# Patient Record
Sex: Female | Born: 1970 | Race: Black or African American | Hispanic: No | Marital: Single | Smoking: Current every day smoker
Health system: Southern US, Community
[De-identification: ages and names within clinical notes are randomized; demographics above are authoritative.]

## PROBLEM LIST (undated history)

## (undated) ENCOUNTER — Ambulatory Visit: Source: Home / Self Care

## (undated) DIAGNOSIS — K219 Gastro-esophageal reflux disease without esophagitis: Secondary | ICD-10-CM

## (undated) DIAGNOSIS — Z8742 Personal history of other diseases of the female genital tract: Secondary | ICD-10-CM

## (undated) DIAGNOSIS — Z8619 Personal history of other infectious and parasitic diseases: Secondary | ICD-10-CM

## (undated) HISTORY — DX: Personal history of other infectious and parasitic diseases: Z86.19

## (undated) HISTORY — DX: Gastro-esophageal reflux disease without esophagitis: K21.9

## (undated) HISTORY — DX: Personal history of other diseases of the female genital tract: Z87.42

## (undated) HISTORY — PX: MULTIPLE TOOTH EXTRACTIONS: SHX2053

---

## 1999-10-25 ENCOUNTER — Emergency Department (HOSPITAL_COMMUNITY): Admission: EM | Admit: 1999-10-25 | Discharge: 1999-10-25 | Payer: Self-pay | Admitting: *Deleted

## 2000-08-07 ENCOUNTER — Emergency Department (HOSPITAL_COMMUNITY): Admission: EM | Admit: 2000-08-07 | Discharge: 2000-08-07 | Payer: Self-pay | Admitting: Emergency Medicine

## 2000-09-18 ENCOUNTER — Emergency Department (HOSPITAL_COMMUNITY): Admission: EM | Admit: 2000-09-18 | Discharge: 2000-09-18 | Payer: Self-pay | Admitting: Emergency Medicine

## 2001-04-12 ENCOUNTER — Emergency Department (HOSPITAL_COMMUNITY): Admission: EM | Admit: 2001-04-12 | Discharge: 2001-04-12 | Payer: Self-pay | Admitting: Emergency Medicine

## 2002-01-08 ENCOUNTER — Emergency Department (HOSPITAL_COMMUNITY): Admission: EM | Admit: 2002-01-08 | Discharge: 2002-01-08 | Payer: Self-pay | Admitting: Emergency Medicine

## 2002-01-08 ENCOUNTER — Encounter: Payer: Self-pay | Admitting: Emergency Medicine

## 2004-03-16 ENCOUNTER — Emergency Department (HOSPITAL_COMMUNITY): Admission: EM | Admit: 2004-03-16 | Discharge: 2004-03-16 | Payer: Self-pay | Admitting: Emergency Medicine

## 2005-11-27 ENCOUNTER — Emergency Department (HOSPITAL_COMMUNITY): Admission: EM | Admit: 2005-11-27 | Discharge: 2005-11-27 | Payer: Self-pay | Admitting: Emergency Medicine

## 2008-02-01 ENCOUNTER — Emergency Department (HOSPITAL_COMMUNITY): Admission: EM | Admit: 2008-02-01 | Discharge: 2008-02-01 | Payer: Self-pay | Admitting: Emergency Medicine

## 2010-06-19 ENCOUNTER — Emergency Department (HOSPITAL_COMMUNITY)
Admission: EM | Admit: 2010-06-19 | Discharge: 2010-06-19 | Disposition: A | Discharge status: Home / Self Care | Payer: Self-pay | Attending: Emergency Medicine | Admitting: Emergency Medicine

## 2010-06-19 DIAGNOSIS — A499 Bacterial infection, unspecified: Secondary | ICD-10-CM | POA: Insufficient documentation

## 2010-06-19 DIAGNOSIS — B9689 Other specified bacterial agents as the cause of diseases classified elsewhere: Secondary | ICD-10-CM | POA: Insufficient documentation

## 2010-06-19 DIAGNOSIS — N760 Acute vaginitis: Secondary | ICD-10-CM | POA: Insufficient documentation

## 2010-06-19 DIAGNOSIS — N72 Inflammatory disease of cervix uteri: Secondary | ICD-10-CM | POA: Insufficient documentation

## 2010-06-19 DIAGNOSIS — M545 Low back pain, unspecified: Secondary | ICD-10-CM | POA: Insufficient documentation

## 2010-06-19 DIAGNOSIS — N390 Urinary tract infection, site not specified: Secondary | ICD-10-CM | POA: Insufficient documentation

## 2010-06-19 LAB — URINALYSIS, ROUTINE W REFLEX MICROSCOPIC
Bilirubin Urine: NEGATIVE
Ketones, ur: NEGATIVE mg/dL
Nitrite: NEGATIVE
Protein, ur: NEGATIVE mg/dL
Specific Gravity, Urine: 1.021 (ref 1.005–1.030)
Urine Glucose, Fasting: NEGATIVE mg/dL
Urobilinogen, UA: 0.2 mg/dL (ref 0.0–1.0)
pH: 6 (ref 5.0–8.0)

## 2010-06-19 LAB — WET PREP, GENITAL
Trich, Wet Prep: NONE SEEN
Yeast Wet Prep HPF POC: NONE SEEN

## 2010-06-19 LAB — URINE MICROSCOPIC-ADD ON

## 2010-06-19 LAB — POCT PREGNANCY, URINE: Preg Test, Ur: NEGATIVE

## 2010-06-20 LAB — GC/CHLAMYDIA PROBE AMP, GENITAL
Chlamydia, DNA Probe: NEGATIVE
GC Probe Amp, Genital: NEGATIVE

## 2010-09-26 NOTE — H&P (Signed)
NAMESHATERRIA, SAGER                           ACCOUNT NO.:  [id number]   MEDICAL RECORD NO.:  [id number]                   PATIENT TYPE:  EMS   LOCATION:  ED                                   FACILITY:  [facility]   PHYSICIAN:  Othelia Pulling, MD                     DATE OF BIRTH:  [birth year]   DATE OF ADMISSION:  01/08/2002  DATE OF DISCHARGE:  01/08/2002                                HISTORY & PHYSICAL   CHIEF COMPLAINT:  Chest pain off and on for several hours.   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  For the past two or three weeks, he has had atypical  symptoms he attributed to indigestion and gas.  He was scheduled to  have a  stress adenosine nuclear test the following day this admission.  He has had  no diaphoresis.  The pain has been localized to the upper precordial area  with slight radiation into the left arm.  He has had no diaphoresis.  Unaware of any arrhythmia.  He has had no dizziness and no nausea or  vomiting.   PAST MEDICAL HISTORY:  1. Cardiac disease, having had severe aortic insufficiency for which he had     an aortic valve replacement at [facility] in 1986.     This was complicated by massive pericardial effusion, but was handled     without difficulty.  He has done extremely well since that date.  He has     worked full time and has been fairly active physically.  Annual     echocardiograms of the heart have revealed good LV function and normal     functioning aortic valve prosthesis.  2. History of hypertension for which he has been on Avapro 300 mg, Inderal     LA 80 mg, HCT 12.5 mg, and has been maintained on Coumadin therapy for     the valve replacement with an INR  in the range of about 2.0.  His last     INR was done one month ago and was reported to 1.7.  He takes 5 mg of     Coumadin daily.   FAMILY HISTORY:  Two brothers living and well who both have had bypass  surgery.  One brother is also a type 2 diabetic.   SOCIAL HISTORY:  Born in [place].  Married.  Two children living and  well.  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, now retired.   HABITS:  No history of tobacco or alcohol abuse.   MEDICATIONS:  Takes those medications listed above.   ALLERGIES:  None.  PAST SURGICAL HISTORY:  Prior surgery only for the valve replacement.   REVIEW OF SYMPTOMS:  HEENT:  Has had no syncope, dizziness, or headache.  Vision and hearing good.  RESPIRATORY:  Has had no cough, shortness of  breath, or symptoms of heart failure.  CARDIOVASCULAR:  See present illness.  It should be noted that he does have by echocardiogram evidence of LVH.  Likewise suspicious for his EKG changes.  GASTROINTESTINAL:  His appetite  has been good.  He has had what he thought to be indigestion and gas for the  past several weeks.  No change in his bowel habits.  ENDOCRINE:  His weight  has been stable.  No heat or cold intolerance.  MUSCULOSKELETAL:  Negative.  NEUROPSYCHIATRIC:  Negative.   PHYSICAL EXAMINATION:  GENERAL APPEARANCE:  A well-developed female in no  distress.  At the time he was examined he had no chest pain.  VITAL SIGNS:  Blood pressure 200/90 dropping to 160/80, pulse 75 with rare  PAC, respirations 18.  WEIGHT:  180 pounds.  HEENT:  Negative.  NECK:  No carotid bruits.  No pulsations.  No JVD.  CHEST:  No deformity.  Midline scar well healed.  HEART:  A grade 1-2 diastolic murmur noted at the base of the heart  radiating down towards the apex.  No evidence of significant gallop.  Has  good closure sounds of the prosthetic valve.  ABDOMEN:  Soft and nontender.  No masses.  GENITALIA:  Normal.  RECTAL:  Not done.  EXTREMITIES:  Good  pedal pulses.  No edema.  NEUROLOGIC:  Negative.  SKIN:  Warm and dry.  LYMPHATICS:  Negative.   LABORATORY DATA:  In the ER, electrocardiogram reveals LVH, left bundle  branch block, and QS waves across the first three precordial leads,  identical to an EKG of about 10 years ago.  Other laboratory work pending.   IMPRESSION:  1. Acute new onset unstable angina.  2. Status post aortic valve replacement in 1988 due to severe aortic     insufficiency.  3. Left ventricular hypertrophy.  4. Strong family history of coronary artery disease.   PLAN:  Due to typical clinical pattern to proceed to monitor and maintain  nitroglycerin drip.  Get the INR down to an adequate level for cardiac  catheterization, which is anticipated within the next 24 hours.                                               Othelia Pulling, MD    JB/MEDQ  D:  02/27/2002  T:  02/27/2002  Job:  [id number]

## 2011-01-04 ENCOUNTER — Emergency Department (HOSPITAL_COMMUNITY)
Admission: EM | Admit: 2011-01-04 | Discharge: 2011-01-04 | Disposition: A | Discharge status: Home / Self Care | Payer: Self-pay | Attending: Emergency Medicine | Admitting: Emergency Medicine

## 2011-01-04 DIAGNOSIS — N739 Female pelvic inflammatory disease, unspecified: Secondary | ICD-10-CM | POA: Insufficient documentation

## 2011-01-04 DIAGNOSIS — F17200 Nicotine dependence, unspecified, uncomplicated: Secondary | ICD-10-CM | POA: Insufficient documentation

## 2011-01-04 DIAGNOSIS — A599 Trichomoniasis, unspecified: Secondary | ICD-10-CM | POA: Insufficient documentation

## 2011-01-04 LAB — URINALYSIS, ROUTINE W REFLEX MICROSCOPIC
Bilirubin Urine: NEGATIVE
Glucose, UA: NEGATIVE mg/dL
Ketones, ur: NEGATIVE mg/dL
Nitrite: NEGATIVE
Protein, ur: NEGATIVE mg/dL
Specific Gravity, Urine: 1.016 (ref 1.005–1.030)
Urobilinogen, UA: 0.2 mg/dL (ref 0.0–1.0)
pH: 6 (ref 5.0–8.0)

## 2011-01-04 LAB — URINE MICROSCOPIC-ADD ON

## 2011-01-04 LAB — WET PREP, GENITAL
WBC, Wet Prep HPF POC: NONE SEEN
Yeast Wet Prep HPF POC: NONE SEEN

## 2011-01-04 LAB — POCT PREGNANCY, URINE: Preg Test, Ur: NEGATIVE

## 2011-01-05 LAB — GC/CHLAMYDIA PROBE AMP, GENITAL
Chlamydia, DNA Probe: NEGATIVE
GC Probe Amp, Genital: NEGATIVE

## 2011-03-31 ENCOUNTER — Emergency Department (HOSPITAL_COMMUNITY)
Admission: EM | Admit: 2011-03-31 | Discharge: 2011-03-31 | Disposition: A | Discharge status: Home / Self Care | Payer: Self-pay | Attending: Emergency Medicine | Admitting: Emergency Medicine

## 2011-03-31 DIAGNOSIS — M6740 Ganglion, unspecified site: Secondary | ICD-10-CM | POA: Insufficient documentation

## 2011-03-31 DIAGNOSIS — R109 Unspecified abdominal pain: Secondary | ICD-10-CM | POA: Insufficient documentation

## 2011-03-31 DIAGNOSIS — F17200 Nicotine dependence, unspecified, uncomplicated: Secondary | ICD-10-CM | POA: Insufficient documentation

## 2011-03-31 DIAGNOSIS — N898 Other specified noninflammatory disorders of vagina: Secondary | ICD-10-CM | POA: Insufficient documentation

## 2011-03-31 LAB — URINALYSIS, ROUTINE W REFLEX MICROSCOPIC
Bilirubin Urine: NEGATIVE
Glucose, UA: NEGATIVE mg/dL
Ketones, ur: NEGATIVE mg/dL
Leukocytes, UA: NEGATIVE
Nitrite: NEGATIVE
Protein, ur: NEGATIVE mg/dL
Specific Gravity, Urine: 1.019 (ref 1.005–1.030)
Urobilinogen, UA: 0.2 mg/dL (ref 0.0–1.0)
pH: 6.5 (ref 5.0–8.0)

## 2011-03-31 LAB — URINE MICROSCOPIC-ADD ON

## 2011-03-31 LAB — PREGNANCY, URINE: Preg Test, Ur: NEGATIVE

## 2011-03-31 NOTE — ED Notes (Signed)
Pt resting on stretcher. Pt cont to await Eval. No chage in pt status. Will cont to monitor

## 2011-03-31 NOTE — ED Provider Notes (Signed)
History    39yf with intermittent crampy lower abdominal pain. No appreciable exacerbating or relieving factors. No radiation. No fever ro chills. No urinary complaints. Denies discharge or unusual bleeding. Says concerned she may actually have discharge and wants checked. No n/v/d. Also c/o small lesion R wrist. Has had for over a month. Does not hurt. Denies trauma. No drainage.  CSN: [id number] Arrival date & time: 03/31/2011  5:18 PM   First MD Initiated Contact with Patient 03/31/11 2011      Chief Complaint  Patient presents with  . Cyst  . Vaginal Discharge  . Abdominal Pain    (Consider location/radiation/quality/duration/timing/severity/associated sxs/prior treatment) HPI  History reviewed. No pertinent past medical history.  History reviewed. No pertinent past surgical history.  History reviewed. No pertinent family history.  History  Substance Use Topics  . Smoking status: Current Everyday Smoker -- 0.5 packs/day    Types: Cigarettes  . Smokeless tobacco: Not on file  . Alcohol Use: No    OB History    Grav Para Term Preterm Abortions TAB SAB Ect Mult Living                  Review of Systems   Review of symptoms negative unless otherwise noted in HPI.   Allergies  Review of patient's allergies indicates no known allergies.  Home Medications   Current Outpatient Rx  Name Route Sig Dispense Refill  . ACETAMINOPHEN 500 MG PO TABS Oral Take 500-1,000 mg by mouth every 6 (six) hours as needed. Pain.       BP 122/84  Pulse 104  Temp 99.4 F (37.4 C)  Resp 16  Ht 5\' 7"  (1.702 m)  Wt 130 lb (58.968 kg)  BMI 20.36 kg/m2  SpO2 100%  LMP 03/03/2011  Physical Exam  Nursing note and vitals reviewed. Constitutional: She appears well-developed and well-nourished. No distress.  HENT:  Head: Normocephalic and atraumatic.  Eyes: Conjunctivae are normal. Right eye exhibits no discharge. Left eye exhibits no discharge.  Neck: Neck supple.    Cardiovascular: Normal rate, regular rhythm and normal heart sounds.  Exam reveals no gallop and no friction rub.   No murmur heard. Pulmonary/Chest: Effort normal and breath sounds normal. No respiratory distress.  Abdominal: Soft. She exhibits no distension. There is no tenderness.  Musculoskeletal: She exhibits no edema and no tenderness.       1cm spherical mass radial aspect R distal wrist. Nontender. No overlying skin changes. Neurovascularly intact distally. Skin intact.  Neurological: She is alert.  Skin: Skin is warm and dry.  Psychiatric: She has a normal mood and affect. Her behavior is normal. Thought content normal.    ED Course  Procedures (including critical care time)  Labs Reviewed  URINALYSIS, ROUTINE W REFLEX MICROSCOPIC - Abnormal; Notable for the following:    Hgb urine dipstick TRACE (*)    All other components within normal limits  PREGNANCY, URINE  URINE MICROSCOPIC-ADD ON   No results found.  8:26 PM Pt refusing pelvic exam. Explained multiple times to pt in multiple in multiple different ways that pelvic exam is standard of care of for evaluation of complaints such as her."I just had one 6 months ago." Explained that not having symptoms then and that cannot adequately assess her complaints without doing one. Explained that could potentially miss serious and potentially life threatening diagnosis such as TOA, etc. Pt still refusing.   1. Vaginal discharge   2. Abdominal pain  MDM  39yf with crampy lower abdominal pain.  Very poor historian. Says no vaginal discharge but thinks she might? Abdominal exam benign. Pr refusing pelvic and understand pontential missed diagnosis. Has medical decision making capability. Mass on distal wrist consistent with ganglion cyst and recommend seeing hand surgery if bothers her.        Raeford Razor, MD 04/02/11 [phone]

## 2011-03-31 NOTE — ED Notes (Signed)
C/o cyst to RT wrist x month.   Also c/o pain to low abd and pelvic area and not sure if she is having vag d/c but would like to find.  Denies pain w/urination.

## 2011-05-12 DIAGNOSIS — Z8742 Personal history of other diseases of the female genital tract: Secondary | ICD-10-CM

## 2011-05-12 HISTORY — DX: Personal history of other diseases of the female genital tract: Z87.42

## 2011-12-16 ENCOUNTER — Encounter (HOSPITAL_COMMUNITY): Payer: Self-pay | Admitting: *Deleted

## 2011-12-16 ENCOUNTER — Emergency Department (HOSPITAL_COMMUNITY)
Admission: EM | Admit: 2011-12-16 | Discharge: 2011-12-16 | Disposition: A | Discharge status: Home / Self Care | Payer: Self-pay | Attending: Emergency Medicine | Admitting: Emergency Medicine

## 2011-12-16 DIAGNOSIS — R109 Unspecified abdominal pain: Secondary | ICD-10-CM | POA: Insufficient documentation

## 2011-12-16 DIAGNOSIS — F17200 Nicotine dependence, unspecified, uncomplicated: Secondary | ICD-10-CM | POA: Insufficient documentation

## 2011-12-16 DIAGNOSIS — T148XXA Other injury of unspecified body region, initial encounter: Secondary | ICD-10-CM

## 2011-12-16 LAB — URINE MICROSCOPIC-ADD ON

## 2011-12-16 LAB — URINALYSIS, ROUTINE W REFLEX MICROSCOPIC
Bilirubin Urine: NEGATIVE
Glucose, UA: NEGATIVE mg/dL
Ketones, ur: NEGATIVE mg/dL
Leukocytes, UA: NEGATIVE
Nitrite: NEGATIVE
Protein, ur: NEGATIVE mg/dL
Specific Gravity, Urine: 1.028 (ref 1.005–1.030)
Urobilinogen, UA: 0.2 mg/dL (ref 0.0–1.0)
pH: 5.5 (ref 5.0–8.0)

## 2011-12-16 MED ORDER — IBUPROFEN 600 MG PO TABS: 600.0000 mg | ORAL_TABLET | Freq: Four times a day (QID) | ORAL | Status: DC | PRN

## 2011-12-16 MED ORDER — METHOCARBAMOL 750 MG PO TABS: 750.0000 mg | ORAL_TABLET | Freq: Four times a day (QID) | ORAL | Status: AC

## 2011-12-16 NOTE — ED Notes (Signed)
Pt escorted to discharge window, in no distress. 

## 2011-12-16 NOTE — ED Notes (Signed)
Pt states since 3 am she's been having R sided abdominal pain, but states "the pain really goes up and down my whole R side". Pt denies burning w/ urination or frequency. Pt states she had her menstrual cycle a week ago and it was "different because she would bleed one day then stop the next then start bleeding again". Pt also states her menstrual cycle was more painful than normal.

## 2011-12-16 NOTE — ED Provider Notes (Signed)
History     CSN: [id number]  Arrival date & time 12/16/11  0850   First MD Initiated Contact with Patient 12/16/11 [phone]      No chief complaint on file.   (Consider location/radiation/quality/duration/timing/severity/associated sxs/prior treatment) The history is provided by the patient.   patient here with right groin pain x3 weeks which is been colicky. Denies any hematuria or dysuria. No vaginal discharge. Pain is worse with cold weather and standing and better with remaining still. Pain starts or inguinal canal and radiates down her leg. Denies any palpable masses in her groin. No prior history of same. Denies any flank pain or abdominal pain. Has tried over-the-counter medications without relief. Denies any rashes  History reviewed. No pertinent past medical history.  History reviewed. No pertinent past surgical history.  History reviewed. No pertinent family history.  History  Substance Use Topics  . Smoking status: Current Everyday Smoker -- 0.5 packs/day    Types: Cigarettes  . Smokeless tobacco: Never Used  . Alcohol Use: Yes    OB History    Grav Para Term Preterm Abortions TAB SAB Ect Mult Living                  Review of Systems  All other systems reviewed and are negative.    Allergies  Review of patient's allergies indicates no known allergies.  Home Medications   Current Outpatient Rx  Name Route Sig Dispense Refill  . IBUPROFEN 200 MG PO TABS Oral Take 400 mg by mouth every 8 (eight) hours as needed. For pain.      BP 132/82  Pulse 94  Temp 98.7 F (37.1 C) (Oral)  Resp 15  SpO2 99%  LMP 12/09/2011  Physical Exam  Nursing note and vitals reviewed. Constitutional: She is oriented to person, place, and time. She appears well-developed and well-nourished.  Non-toxic appearance. No distress.  HENT:  Head: Normocephalic and atraumatic.  Eyes: Conjunctivae, EOM and lids are normal. Pupils are equal, round, and reactive to light.  Neck: Normal  range of motion. Neck supple. No tracheal deviation present. No mass present.  Cardiovascular: Normal rate, regular rhythm and normal heart sounds.  Exam reveals no gallop.   No murmur heard. Pulmonary/Chest: Effort normal and breath sounds normal. No stridor. No respiratory distress. She has no decreased breath sounds. She has no wheezes. She has no rhonchi. She has no rales.  Abdominal: Soft. Normal appearance and bowel sounds are normal. She exhibits no distension. There is no tenderness. There is no rigidity, no rebound, no guarding and no CVA tenderness.    Musculoskeletal: Normal range of motion. She exhibits no edema and no tenderness.  Neurological: She is alert and oriented to person, place, and time. She has normal strength. No cranial nerve deficit or sensory deficit. GCS eye subscore is 4. GCS verbal subscore is 5. GCS motor subscore is 6.  Skin: Skin is warm and dry. No abrasion and no rash noted.  Psychiatric: She has a normal mood and affect. Her speech is normal and behavior is normal.    ED Course  Procedures (including critical care time)  Labs Reviewed - No data to display No results found.   No diagnosis found.    MDM  ua results noted--suspect muscle strain, will d/c        Toy Baker, MD 12/16/11 1141

## 2011-12-16 NOTE — ED Notes (Signed)
Pt states started having R side pain from shoulder down to leg, pain worse on R side of abdomen, states "had cycle few days ago and thought pain was from that". States her cycle was different this time, states "I would bleed one day and not bleed the next".

## 2011-12-17 ENCOUNTER — Encounter (HOSPITAL_COMMUNITY): Payer: Self-pay | Admitting: *Deleted

## 2011-12-17 ENCOUNTER — Emergency Department (INDEPENDENT_AMBULATORY_CARE_PROVIDER_SITE_OTHER)
Admission: EM | Admit: 2011-12-17 | Discharge: 2011-12-17 | Disposition: A | Discharge status: Home / Self Care | Payer: Self-pay | Source: Home / Self Care | Attending: Emergency Medicine | Admitting: Emergency Medicine

## 2011-12-17 DIAGNOSIS — N949 Unspecified condition associated with female genital organs and menstrual cycle: Secondary | ICD-10-CM

## 2011-12-17 DIAGNOSIS — R102 Pelvic and perineal pain: Secondary | ICD-10-CM

## 2011-12-17 LAB — POCT URINALYSIS DIP (DEVICE)
Bilirubin Urine: NEGATIVE
Glucose, UA: NEGATIVE mg/dL
Ketones, ur: NEGATIVE mg/dL
Leukocytes, UA: NEGATIVE
Nitrite: NEGATIVE
Protein, ur: NEGATIVE mg/dL
Specific Gravity, Urine: 1.025 (ref 1.005–1.030)
Urobilinogen, UA: 0.2 mg/dL (ref 0.0–1.0)
pH: 5.5 (ref 5.0–8.0)

## 2011-12-17 LAB — POCT PREGNANCY, URINE: Preg Test, Ur: NEGATIVE

## 2011-12-17 MED ORDER — NAPROXEN 500 MG PO TABS: 500.0000 mg | ORAL_TABLET | Freq: Two times a day (BID) | ORAL | Status: AC

## 2011-12-17 NOTE — ED Notes (Signed)
Pt  Reports  intermittant  abd  Cramps  rlq  intermittantly     Over the  Last  Month    Pt  Reports  Pain  Worse  Over  The  Last  2  Days      She  denys  Any  Discharge  Or     Any     Vaginal bleeding           She  Ambulated  Upright    With a  Steady  Fluid  Gait      She  Is  Sitting  Upright on the  Exam table  Speaking in  Complete  sentances            Pt   Was  [facility]  [facility]  [facility]  For  A  Poss  Muscle  Strain    -  She  Had  A  Urine  Test  done

## 2011-12-17 NOTE — ED Provider Notes (Signed)
History     CSN: [id number]  Arrival date & time 12/17/11  1206   First MD Initiated Contact with Patient 12/17/11 1214      Chief Complaint  Patient presents with  . Abdominal Cramping    (Consider location/radiation/quality/duration/timing/severity/associated sxs/prior treatment) Patient is a 41 y.o. female presenting with cramps. The history is provided by the patient.  Abdominal Cramping The primary symptoms of the illness include abdominal pain and vaginal bleeding. The primary symptoms of the illness do not include fever, fatigue, vomiting, diarrhea, dysuria or vaginal discharge. The current episode started more than 2 days ago. The problem has not changed since onset. Symptoms associated with the illness do not include frequency.    History reviewed. No pertinent past medical history.  History reviewed. No pertinent past surgical history.  No family history on file.  History  Substance Use Topics  . Smoking status: Current Everyday Smoker -- 0.5 packs/day    Types: Cigarettes  . Smokeless tobacco: Never Used  . Alcohol Use: Yes    OB History    Grav Para Term Preterm Abortions TAB SAB Ect Mult Living                  Review of Systems  Constitutional: Negative for fever, activity change, appetite change and fatigue.  Eyes: Negative for pain.  Gastrointestinal: Positive for abdominal pain. Negative for vomiting and diarrhea.  Genitourinary: Positive for vaginal bleeding. Negative for dysuria, frequency, flank pain, vaginal discharge and vaginal pain.  Skin: Negative for color change, pallor, rash and wound.  Neurological: Negative for dizziness.    Allergies  Review of patient's allergies indicates no known allergies.  Home Medications   Current Outpatient Rx  Name Route Sig Dispense Refill  . METHOCARBAMOL 750 MG PO TABS Oral Take 1 tablet (750 mg total) by mouth 4 (four) times daily. 20 tablet 0  . NAPROXEN 500 MG PO TABS Oral Take 1 tablet (500 mg  total) by mouth 2 (two) times daily with a meal. 30 tablet 0    BP 126/59  Pulse 68  Temp 98.6 F (37 C) (Oral)  Resp 18  SpO2 100%  LMP 12/09/2011  Physical Exam  Nursing note and vitals reviewed. Constitutional: Vital signs are normal. She appears well-developed and well-nourished.  Non-toxic appearance. She does not have a sickly appearance. She does not appear ill. No distress.  HENT:  Mouth/Throat: No oropharyngeal exudate.  Eyes: Conjunctivae are normal. No scleral icterus.  Cardiovascular:  No murmur heard. Pulmonary/Chest: No respiratory distress.  Abdominal: Normal appearance. There is no hepatosplenomegaly, splenomegaly or hepatomegaly. There is tenderness. There is no rigidity, no rebound, no guarding, no CVA tenderness, no tenderness at McBurney's point and negative Murphy's sign. No hernia.    Musculoskeletal: She exhibits no tenderness.  Lymphadenopathy:    She has no cervical adenopathy.  Neurological: She is alert.  Skin: No rash noted. No erythema.    ED Course  Procedures (including critical care time)  Labs Reviewed  POCT URINALYSIS DIP (DEVICE) - Abnormal; Notable for the following:    Hgb urine dipstick SMALL (*)     All other components within normal limits  POCT PREGNANCY, URINE   No results found.   1. Pelvic pain in female       MDM  Recurrent pelvic pain. Today not reproducible on exam. Have advised patient to followup with the GYN Dr. as patient has not had a Pap smear the last 5 years for further evaluation.  I do not believe at this point this represents an acute abdominal condition. Patient was advised to use naproxen as needed. We also discussed in detail what symptoms should warrant her to go to the emergency department for further evaluation.       Jimmie Molly, MD 12/17/11 2024

## 2012-01-26 ENCOUNTER — Encounter: Payer: Self-pay | Admitting: Obstetrics & Gynecology

## 2012-02-01 ENCOUNTER — Encounter: Payer: Self-pay | Admitting: Obstetrics & Gynecology

## 2012-02-24 ENCOUNTER — Ambulatory Visit (INDEPENDENT_AMBULATORY_CARE_PROVIDER_SITE_OTHER): Payer: Self-pay | Admitting: Obstetrics & Gynecology

## 2012-02-24 ENCOUNTER — Encounter: Payer: Self-pay | Admitting: Obstetrics & Gynecology

## 2012-02-24 VITALS — Temp 97.2°F | Resp 20 | Ht 67.0 in | Wt 123.8 lb

## 2012-02-24 DIAGNOSIS — G8929 Other chronic pain: Secondary | ICD-10-CM

## 2012-02-24 DIAGNOSIS — R102 Pelvic and perineal pain: Secondary | ICD-10-CM

## 2012-02-24 DIAGNOSIS — N949 Unspecified condition associated with female genital organs and menstrual cycle: Secondary | ICD-10-CM

## 2012-02-24 DIAGNOSIS — K625 Hemorrhage of anus and rectum: Secondary | ICD-10-CM

## 2012-02-24 NOTE — Progress Notes (Signed)
Pt reports RLQ pain x6-12 months. She also is having a vaginal d/c.

## 2012-02-24 NOTE — Progress Notes (Signed)
  Subjective:    Patient ID: Lori Perkins, female    DOB: 09/06/1970, 41 y.o.   MRN: [id number]  HPI  41 yo S AA lady who is here for a follow up visit after a ED visit for abdominal cramping for 6-12 months. She also reported some recent rectal bleeding with wiping. She denies anal intercourse. She has been abstinent for about a year.  Review of Systems No recent pap smear. I gave her the information for the free pap smear clinic.    Objective:   Physical Exam  Large hemorrhoid with macerated, almost wart-like anterior surface Normal vagina, frothy white discharge NSSA, no adnexal masses or tenderness     Assessment & Plan:  CPP- cervical cultures and wet prep and u/s Rectal bleeding/hemorroid(?)- ref to gen surg

## 2012-02-24 NOTE — Addendum Note (Signed)
Addended by: Franchot Mimes on: 02/24/2012 04:20 PM   Modules accepted: Orders

## 2012-02-25 LAB — WET PREP, GENITAL
Trich, Wet Prep: NONE SEEN
Yeast Wet Prep HPF POC: NONE SEEN

## 2012-02-25 LAB — GC/CHLAMYDIA PROBE AMP, GENITAL
Chlamydia, DNA Probe: NEGATIVE
GC Probe Amp, Genital: NEGATIVE

## 2012-03-01 ENCOUNTER — Ambulatory Visit (HOSPITAL_COMMUNITY)
Admission: RE | Admit: 2012-03-01 | Discharge: 2012-03-01 | Disposition: A | Discharge status: Home / Self Care | Payer: Self-pay | Source: Ambulatory Visit | Attending: Obstetrics & Gynecology | Admitting: Obstetrics & Gynecology

## 2012-03-01 DIAGNOSIS — G8929 Other chronic pain: Secondary | ICD-10-CM

## 2012-03-01 DIAGNOSIS — N72 Inflammatory disease of cervix uteri: Secondary | ICD-10-CM | POA: Insufficient documentation

## 2012-03-01 DIAGNOSIS — N949 Unspecified condition associated with female genital organs and menstrual cycle: Secondary | ICD-10-CM | POA: Insufficient documentation

## 2012-03-01 DIAGNOSIS — R102 Pelvic and perineal pain: Secondary | ICD-10-CM

## 2012-03-08 ENCOUNTER — Ambulatory Visit (INDEPENDENT_AMBULATORY_CARE_PROVIDER_SITE_OTHER): Payer: Self-pay | Admitting: General Surgery

## 2012-03-16 ENCOUNTER — Telehealth: Payer: Self-pay | Admitting: *Deleted

## 2012-03-16 NOTE — Telephone Encounter (Signed)
Called Lori Perkins and she states she has already spoken with someone from our office, requests to know what I am calling back for - restated I am calling her back from the message she left yesterday that I took off today  and I wasn't aware she had already spoke with anyone. Requests to know results and I informed her I need her to verify her DOB and she would not. She states she will call us back if she needs any other information.

## 2012-03-16 NOTE — Telephone Encounter (Signed)
Lori Perkins called and left a message requesting a call back re: [place] results, pelvic exam results and an appointment she has scheduled and was told needs to talk to nurse about.

## 2012-03-23 ENCOUNTER — Ambulatory Visit: Payer: Self-pay | Admitting: Obstetrics & Gynecology

## 2012-03-24 ENCOUNTER — Ambulatory Visit (INDEPENDENT_AMBULATORY_CARE_PROVIDER_SITE_OTHER): Payer: Self-pay | Admitting: General Surgery

## 2012-03-24 ENCOUNTER — Encounter (INDEPENDENT_AMBULATORY_CARE_PROVIDER_SITE_OTHER): Payer: Self-pay | Admitting: General Surgery

## 2012-03-28 ENCOUNTER — Encounter: Payer: Self-pay | Admitting: Obstetrics & Gynecology

## 2012-03-28 ENCOUNTER — Ambulatory Visit (INDEPENDENT_AMBULATORY_CARE_PROVIDER_SITE_OTHER): Payer: Self-pay | Admitting: Obstetrics & Gynecology

## 2012-03-28 VITALS — BP 140/85 | Temp 98.5°F | Ht 67.0 in | Wt 124.7 lb

## 2012-03-28 DIAGNOSIS — Z0000 Encounter for general adult medical examination without abnormal findings: Secondary | ICD-10-CM

## 2012-03-28 DIAGNOSIS — N760 Acute vaginitis: Secondary | ICD-10-CM

## 2012-03-28 DIAGNOSIS — A499 Bacterial infection, unspecified: Secondary | ICD-10-CM

## 2012-03-28 DIAGNOSIS — B9689 Other specified bacterial agents as the cause of diseases classified elsewhere: Secondary | ICD-10-CM

## 2012-03-28 MED ORDER — METRONIDAZOLE 500 MG PO TABS: 500.0000 mg | ORAL_TABLET | Freq: Three times a day (TID) | ORAL | Status: DC

## 2012-03-28 NOTE — Progress Notes (Signed)
  Subjective:    Patient ID: Lori Perkins, female    DOB: 06/30/1970, 41 y.o.   MRN: [id number]  HPI  41 yo AA lady with several complaints. 1) continued rectal bleeding and pain. She has an appt with a general surgeon in 2 days. 2) abdominal cramping- improved. 3) vaginal discharge (wet prep consistent with BV)  Review of Systems She did not get a pap at the free clinic.    Objective:   Physical Exam  Hemorrhoid appearance somewhat improved Vaginal discharge c/w BV      Assessment & Plan:  BV- generic flagyl for a week  Cramping- improved, reassurance given  I did a pap smear today.

## 2012-03-30 ENCOUNTER — Ambulatory Visit (INDEPENDENT_AMBULATORY_CARE_PROVIDER_SITE_OTHER): Payer: Self-pay | Admitting: General Surgery

## 2012-03-30 ENCOUNTER — Ambulatory Visit (INDEPENDENT_AMBULATORY_CARE_PROVIDER_SITE_OTHER): Payer: Self-pay | Admitting: Surgery

## 2012-04-05 ENCOUNTER — Telehealth: Payer: Self-pay | Admitting: Medical

## 2012-04-05 NOTE — Telephone Encounter (Signed)
Message copied by Freddi Starr on Tue Apr 05, 2012 10:07 AM ------      Message from: Malena Catholic      Created: Mon Apr 04, 2012 10:58 AM       Pt appt 12/13 at 9:15      ----- Message -----         From: Drucilla Schmidt Day, RN         Sent: 04/01/2012   9:21 AM           To: [facility] Admin Pool            Please schedule colpo appt and return message to clinical pool. We will call pt.  Thanks       ----- Message -----         From: Allie Bossier, MD         Sent: 04/01/2012   8:24 AM           To: [facility] Clinical Pool            She needs a colposcopy in the near future.      Thanks      [employer]

## 2012-04-05 NOTE — Telephone Encounter (Signed)
LM for patient to return call to clinic. Patient needs to be informed of LGSIL on recent pap and given colpo appointment for 04/22/12 @ 9:15 am.

## 2012-04-06 ENCOUNTER — Telehealth: Payer: Self-pay | Admitting: General Practice

## 2012-04-06 NOTE — Telephone Encounter (Signed)
Called patient, went to voicemail- left message to call us back at the clinic for some information and that we will be closed for Thursday and Friday, please call before then if you can.

## 2012-04-12 NOTE — Telephone Encounter (Signed)
LM for patient to return call to clinic. Needs to be informed of LGSIL pap and Colpo appointment as noted previously

## 2012-04-13 NOTE — Telephone Encounter (Signed)
LM for patient to return call to clinic for appointment information and to leave message with a better phone number if this is not a good one.

## 2012-04-13 NOTE — Telephone Encounter (Signed)
Spoke with patient. Informed her of abnormal pap results and need for colposcopy. Informed her of appointment time and date. Patient plans to keep appointment. Patient voiced understanding and had no further questions at this time.

## 2012-04-22 ENCOUNTER — Encounter: Payer: Self-pay | Admitting: Obstetrics & Gynecology

## 2012-04-22 ENCOUNTER — Telehealth: Payer: Self-pay | Admitting: *Deleted

## 2012-04-22 NOTE — Telephone Encounter (Addendum)
Pt left message requesting to speak with Lori Perkins or a nurse. Please call back.  I called pt and left message for her to please call the nurse voice mail and leave a detailed message of her problem or question. Someone will call her back on Monday 12/16. If she is having an urgent Gyn problem, she should go to [facility] for evaluation.

## 2012-04-25 NOTE — Telephone Encounter (Signed)
Opened in error

## 2012-05-12 ENCOUNTER — Encounter: Payer: Self-pay | Admitting: Obstetrics & Gynecology

## 2012-05-31 ENCOUNTER — Ambulatory Visit (INDEPENDENT_AMBULATORY_CARE_PROVIDER_SITE_OTHER): Payer: Self-pay | Admitting: General Surgery

## 2012-06-06 ENCOUNTER — Ambulatory Visit (INDEPENDENT_AMBULATORY_CARE_PROVIDER_SITE_OTHER): Payer: Self-pay | Admitting: Obstetrics & Gynecology

## 2012-06-06 ENCOUNTER — Other Ambulatory Visit (HOSPITAL_COMMUNITY)
Admission: RE | Admit: 2012-06-06 | Discharge: 2012-06-06 | Disposition: A | Discharge status: Home / Self Care | Payer: Self-pay | Source: Ambulatory Visit | Attending: Obstetrics & Gynecology | Admitting: Obstetrics & Gynecology

## 2012-06-06 ENCOUNTER — Encounter: Payer: Self-pay | Admitting: Obstetrics & Gynecology

## 2012-06-06 VITALS — BP 125/85 | HR 115 | Temp 98.9°F | Ht 66.0 in | Wt 124.9 lb

## 2012-06-06 DIAGNOSIS — R87619 Unspecified abnormal cytological findings in specimens from cervix uteri: Secondary | ICD-10-CM | POA: Insufficient documentation

## 2012-06-06 DIAGNOSIS — R87612 Low grade squamous intraepithelial lesion on cytologic smear of cervix (LGSIL): Secondary | ICD-10-CM

## 2012-06-06 DIAGNOSIS — Z113 Encounter for screening for infections with a predominantly sexual mode of transmission: Secondary | ICD-10-CM

## 2012-06-06 DIAGNOSIS — Z01812 Encounter for preprocedural laboratory examination: Secondary | ICD-10-CM

## 2012-06-06 LAB — POCT PREGNANCY, URINE: Preg Test, Ur: NEGATIVE

## 2012-06-06 NOTE — Addendum Note (Signed)
Addended by: Toula Moos on: 06/06/2012 04:07 PM   Modules accepted: Orders

## 2012-06-06 NOTE — Patient Instructions (Signed)
Colposcopy Care After Colposcopy is a procedure in which a special tool is used to magnify the surface of the cervix. A tissue sample (biopsy) may also be taken. This sample will be looked at for cervical cancer or other problems. After the test:  You may have some cramping.  Lie down for a few minutes if you feel lightheaded.   You may have some bleeding which should stop in a few days. HOME CARE  Do not have sex or use tampons for 2 to 3 days or as told.  Only take medicine as told by your doctor.  Continue to take your birth control pills as usual. Finding out the results of your test Ask when your test results will be ready. Make sure you get your test results. GET HELP RIGHT AWAY IF:  You are bleeding a lot or are passing blood clots.  You develop a fever of 102 F (38.9 C) or higher.  You have abnormal vaginal discharge.  You have cramps that do not go away with medicine.  You feel lightheaded, dizzy, or pass out (faint). MAKE SURE YOU:   Understand these instructions.  Will watch your condition.  Will get help right away if you are not doing well or get worse. Document Released: 10/14/2007 Document Revised: 07/20/2011 Document Reviewed: 10/14/2007 [facility] Patient Information 2013 [place], [place]. Colposcopy Colposcopy is a procedure that uses a special lighted microscope (colposcope). It examines your cervix and vagina, or the area around the outside of the vagina, for signs of disease or abnormalities in the cells. You may be sent to a specialist (gynecologist) to do the colposcopy. A biopsy (tissue sample) may be collected during a colposcopy, if the caregiver finds any unusual cells. The biopsy is sent to the lab for further testing, and the results are reported back to your caregiver. A WOMAN MAY NEED THIS PROCEDURE IF:  She has had an abnormal pap smear (taking cells from the cervix for testing).  She has a sore on her cervix, and a Pap test was  normal.  The Pap test suggests human papilloma virus (HPV). This virus can cause genital warts and is linked to the development of cervical cancer.  She has genital warts on the cervix, or in or around the outside of the vagina.  Her mother took the drug DES while pregnant.  She has painful intercourse.  She has vaginal bleeding, especially after sexual intercourse.  There is a need to evaluate the results of previous treatment. BEFORE THE PROCEDURE   Colposcopy is done when you are not having a menstrual period.  For 24 hours before the colposcopy, do not:  Douche.  Use tampons.  Use medicines, creams, or suppositories in the vagina.  Have sexual intercourse. PROCEDURE   A colposcopy is done while a woman is lying on her back with her feet in foot rests (stirrups).  A speculum is placed inside the vagina to keep it open and to allow the caregiver to see the cervix. This is the same instrument used to do a pap smear.  The colposcope is placed outside the vagina. It is used to magnify and examine the cervix, vagina, and the area around the outside of the vagina.  A small amount of liquid solution is placed on the area that is to be viewed. This solution is placed on with a cotton applicator. This solution makes it easier to see the abnormal cells.  Your caregiver will suck out mucus and cells from the canal  of the cervix.  Small pieces of tissue for biopsy may be taken at the same time. You may feel mild pain or discomfort when this is done.  Your caregiver will record the location of the abnormal areas and send the tissue samples to a lab for analysis.  If your caregiver biopsies the vagina or outside of the vagina, a local anesthetic (novocaine) is usually given. AFTER THE PROCEDURE   You may have some cramping that often goes away in a few minutes. You may have some soreness for a couple of days.  You may take over-the-counter pain medicine as advised by your  caregiver. Do not take aspirin because it can cause bleeding.  Lie down for a few minutes if you feel lightheaded.  You may have some bleeding or dark discharge that should stop in a few days.  You may need to wear a sanitary pad for a few days. HOME CARE INSTRUCTIONS   Avoid sex, douching, and using tampons for a week or as directed.  Only take medicine as directed by your caregiver.  Continue to take birth control pills, if you are on them.  Not all test results are available during your visit. If your test results are not back during the visit, make an appointment with your caregiver to find out the results. Do not assume everything is normal if you have not heard from your caregiver or the medical facility. It is important for you to follow up on all of your test results.  Follow your caregiver's advice regarding medicines, activity, follow-up visits, and follow-up Pap tests. SEEK MEDICAL CARE IF:   You develop a rash.  You have problems with your medicine. SEEK IMMEDIATE MEDICAL CARE IF:  You are bleeding heavily or are passing blood clots.  You develop a fever over 102 F (38.9 C), with or without chills.  You have abnormal vaginal discharge.  You are having cramps that do not go away after taking your pain medicine.  You feel lightheaded, dizzy, or faint.  You develop stomach pain. Document Released: 07/18/2002 Document Revised: 07/20/2011 Document Reviewed: 02/28/2009 [facility] Patient Information 2013 [place], [place].

## 2012-06-06 NOTE — Addendum Note (Signed)
Addended by: Soyla Murphy T on: 06/06/2012 04:20 PM   Modules accepted: Orders

## 2012-06-06 NOTE — Progress Notes (Signed)
  Subjective:    Patient ID: Lori Perkins, female    DOB: 1970-12-18, 42 y.o.   MRN: [id number]  HPI  Lori Perkins is here today for her colpo. 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   Review of Systems     Objective:   Physical Exam  Colposcopy adequate acetowhite changes near the os c/w LGSIL, no evidence of HGSIL ECC obtained She tolerated the procedure well      Assessment & Plan:  LGSIL- await ECC RTC 2 weeks for results.

## 2012-06-14 ENCOUNTER — Encounter (INDEPENDENT_AMBULATORY_CARE_PROVIDER_SITE_OTHER): Payer: Self-pay | Admitting: General Surgery

## 2012-06-16 ENCOUNTER — Encounter (INDEPENDENT_AMBULATORY_CARE_PROVIDER_SITE_OTHER): Payer: Self-pay | Admitting: General Surgery

## 2012-06-22 ENCOUNTER — Ambulatory Visit: Payer: Self-pay | Admitting: Obstetrics & Gynecology

## 2012-06-22 ENCOUNTER — Telehealth: Payer: Self-pay | Admitting: *Deleted

## 2012-06-22 NOTE — Telephone Encounter (Signed)
Patient informed. 

## 2012-06-22 NOTE — Telephone Encounter (Signed)
Message copied by Mannie Stabile on Wed Jun 22, 2012  1:09 PM ------      Message from: Willodean Rosenthal      Created: Wed Jun 22, 2012 11:46 AM       Please call pt:            Her biopsies showed no dysplasia.  She needs a repeat PAP in 1 year.  She does NOT need to keep appt today.            clh-S ------

## 2012-06-25 ENCOUNTER — Other Ambulatory Visit: Payer: Self-pay

## 2012-09-15 ENCOUNTER — Ambulatory Visit: Payer: Self-pay | Admitting: Obstetrics & Gynecology

## 2012-10-12 ENCOUNTER — Ambulatory Visit: Payer: Self-pay | Admitting: Obstetrics & Gynecology

## 2013-03-16 ENCOUNTER — Other Ambulatory Visit: Payer: Self-pay

## 2013-10-22 IMAGING — US US TRANSVAGINAL NON-OB
1 series · 13 of 25 positions shown · non-contrast
Comparison: Report of prior non digitized CT 01/08/2002

CLINICAL DATA: Pelvic pain



[Series 1: us pelvis complete · 75 acquisitions, 13 frames shown]
[im 1/75]
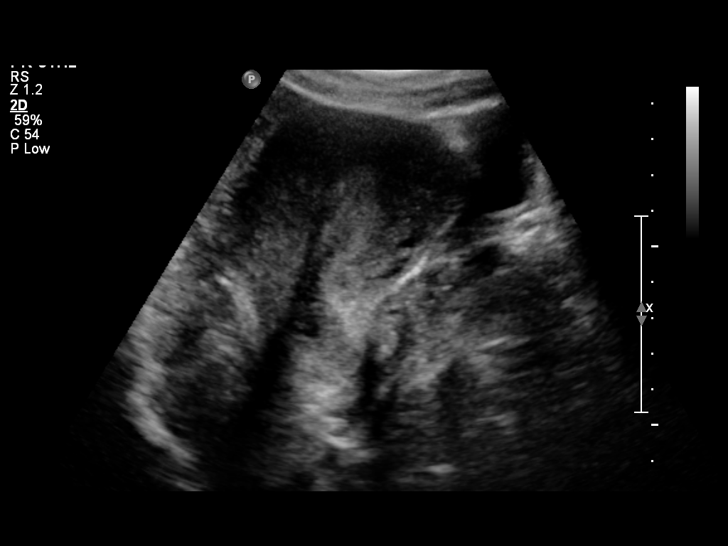
[im 7/75]
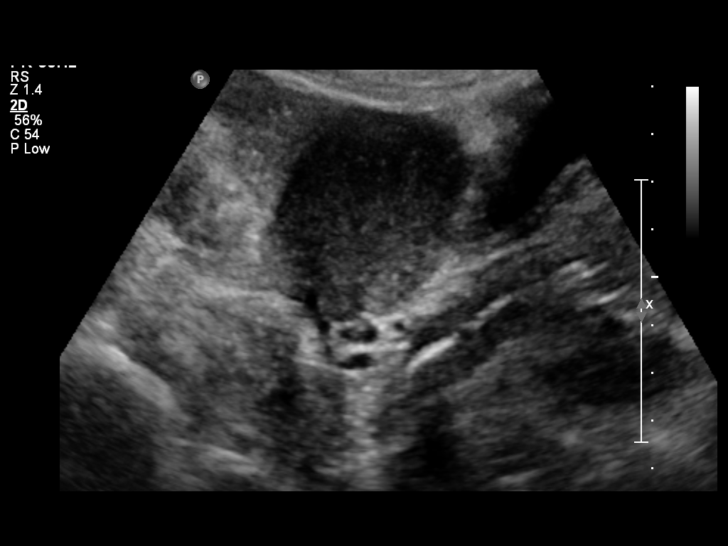
[im 13/75]
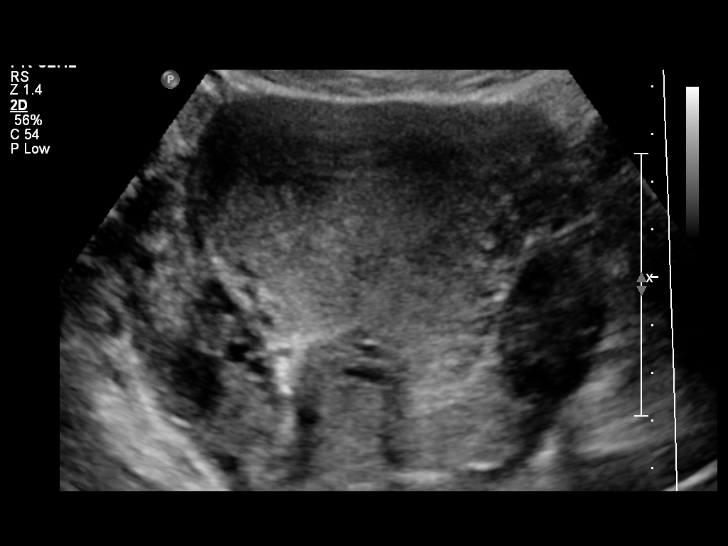
[im 19/75]
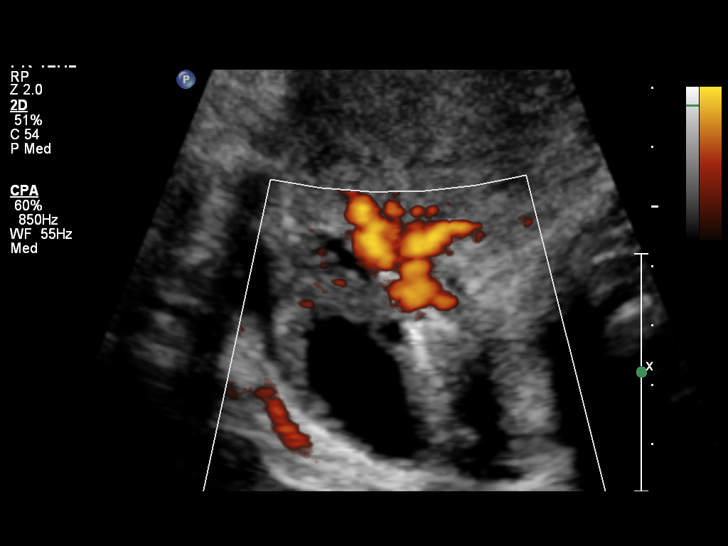
[im 25/75]
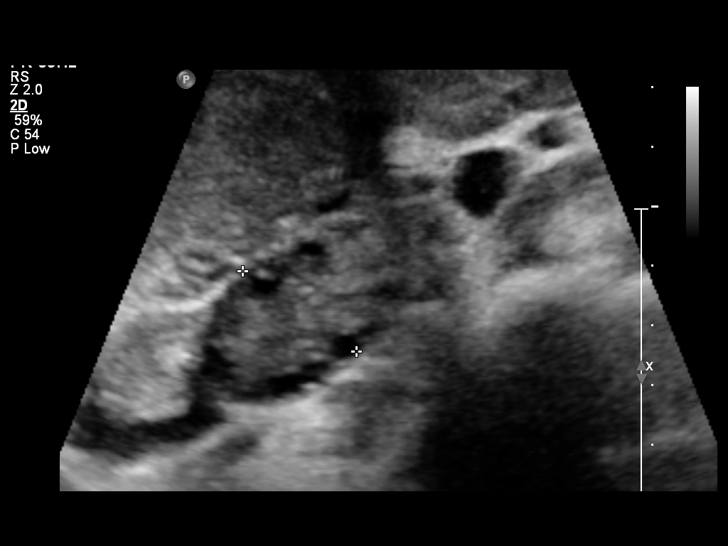
[im 31/75]
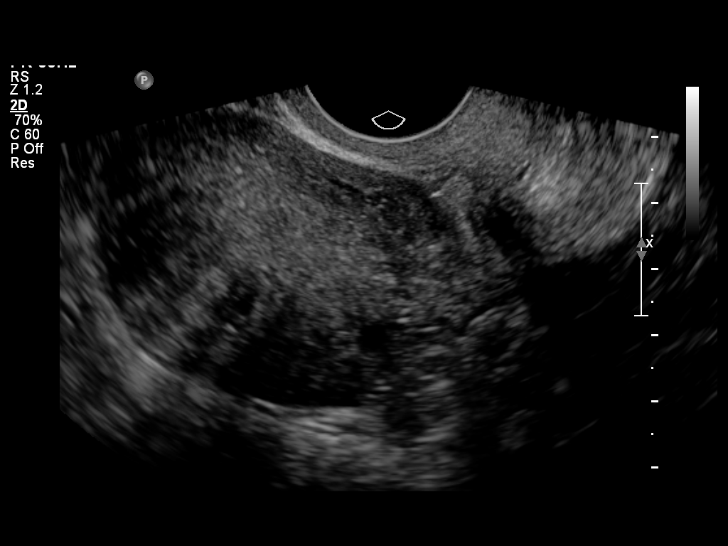
[im 38/75]
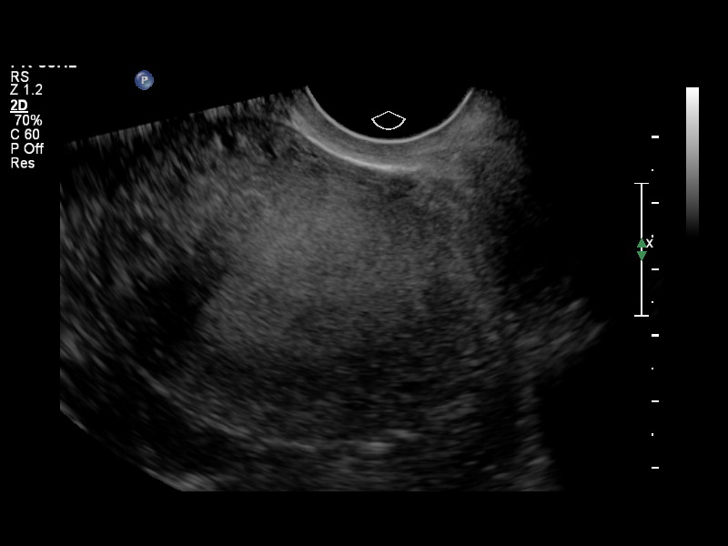
[im 44/75]
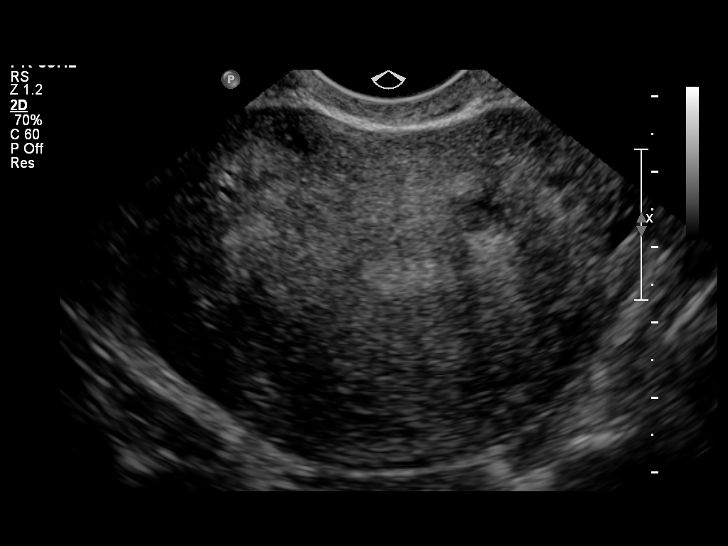
[im 50/75]
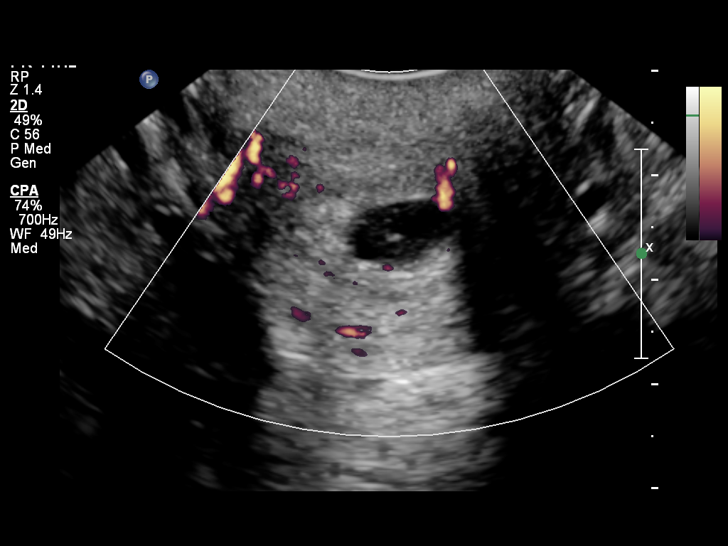
[im 56/75]
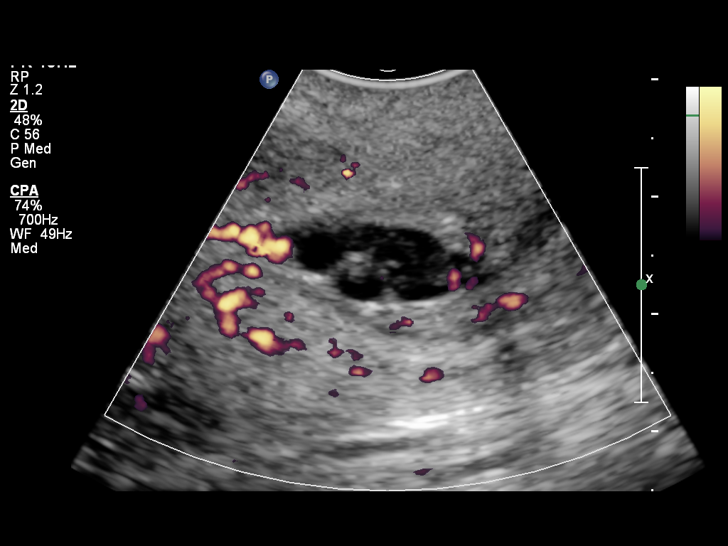
[im 62/75]
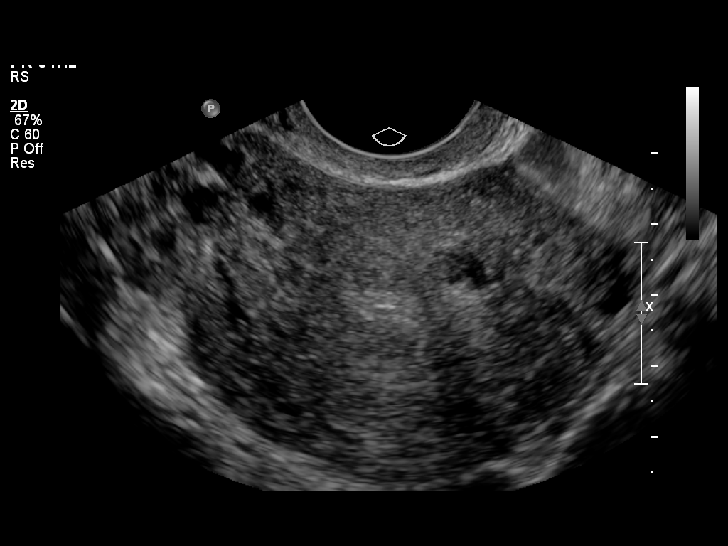
[im 68/75]
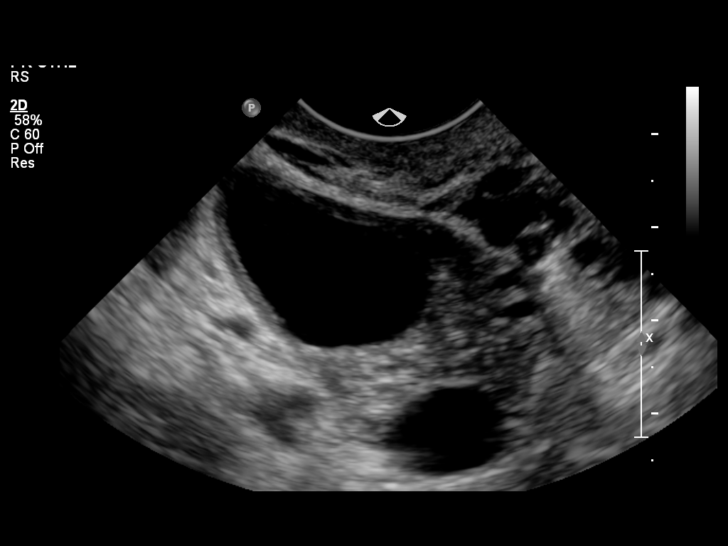
[im 75/75]
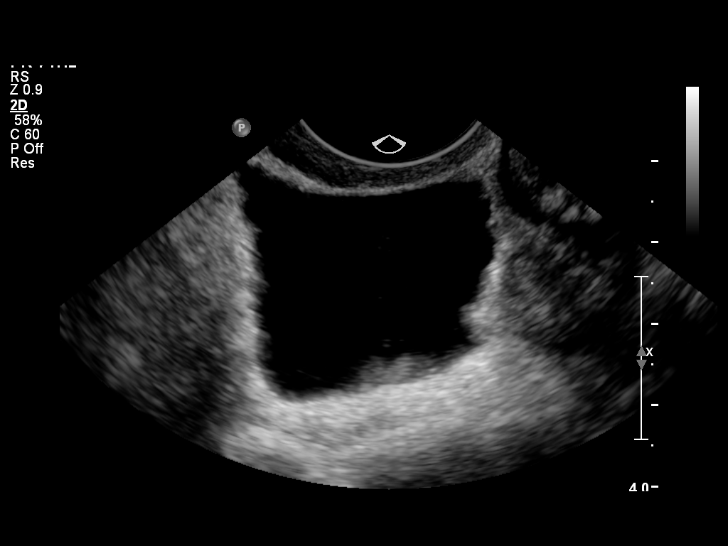

[13 of 25 positions shown; findings below may reference images not displayed]

FINDINGS: Uterus:  Anteverted, anteflexed.  8.6 x 4.8 x 7.0 cm.  Left
anterolateral partly necrotic fibroid or an endometrial cyst
measures 0.9 x 1.0 x 0.8 cm. Probable complicated Nabothian cyst
incidentally noted.

Endometrium: 4 mm.  Uniformly thin and echogenic.

Right ovary: 4.3 x 3.0 x 2.7 cm.  Normal.

Left ovary: 3.8 x 3.0 x 2.9 cm.  Normal.

Other Findings:  Moderate free fluid in the cul-de-sac.
IMPRESSION: Probable complicated Nabothian cyst incidentally noted. The
appearance is considered much less typical for cervical cancer,
correlate with recent Pap smear results as part of the patient's
overall help screening. If the patient has vaginal discharge,
differential considerations could include adenoma malignum, but
this is considered much less likely.

Left lateral subendometrial cyst or possible small necrotic
fibroid.

## 2014-01-16 ENCOUNTER — Encounter (HOSPITAL_COMMUNITY): Payer: Self-pay | Admitting: Emergency Medicine

## 2014-01-16 ENCOUNTER — Emergency Department (HOSPITAL_COMMUNITY)
Admission: EM | Admit: 2014-01-16 | Discharge: 2014-01-16 | Disposition: A | Discharge status: Home / Self Care | Payer: No Typology Code available for payment source | Attending: Emergency Medicine | Admitting: Emergency Medicine

## 2014-01-16 DIAGNOSIS — A499 Bacterial infection, unspecified: Secondary | ICD-10-CM | POA: Diagnosis not present

## 2014-01-16 DIAGNOSIS — Z8619 Personal history of other infectious and parasitic diseases: Secondary | ICD-10-CM

## 2014-01-16 DIAGNOSIS — N898 Other specified noninflammatory disorders of vagina: Secondary | ICD-10-CM | POA: Insufficient documentation

## 2014-01-16 DIAGNOSIS — Z792 Long term (current) use of antibiotics: Secondary | ICD-10-CM | POA: Insufficient documentation

## 2014-01-16 DIAGNOSIS — B9689 Other specified bacterial agents as the cause of diseases classified elsewhere: Secondary | ICD-10-CM | POA: Diagnosis not present

## 2014-01-16 DIAGNOSIS — F17200 Nicotine dependence, unspecified, uncomplicated: Secondary | ICD-10-CM | POA: Diagnosis not present

## 2014-01-16 DIAGNOSIS — Z3202 Encounter for pregnancy test, result negative: Secondary | ICD-10-CM | POA: Insufficient documentation

## 2014-01-16 DIAGNOSIS — N760 Acute vaginitis: Secondary | ICD-10-CM | POA: Insufficient documentation

## 2014-01-16 HISTORY — DX: Personal history of other infectious and parasitic diseases: Z86.19

## 2014-01-16 LAB — CBC WITH DIFFERENTIAL/PLATELET
Basophils Absolute: 0 10*3/uL (ref 0.0–0.1)
Basophils Relative: 0 % (ref 0–1)
Eosinophils Absolute: 0 10*3/uL (ref 0.0–0.7)
Eosinophils Relative: 0 % (ref 0–5)
HCT: 40.1 % (ref 36.0–46.0)
Hemoglobin: 13.2 g/dL (ref 12.0–15.0)
Lymphocytes Relative: 24 % (ref 12–46)
Lymphs Abs: 2.6 10*3/uL (ref 0.7–4.0)
MCH: 30.7 pg (ref 26.0–34.0)
MCHC: 32.9 g/dL (ref 30.0–36.0)
MCV: 93.3 fL (ref 78.0–100.0)
Monocytes Absolute: 0.8 10*3/uL (ref 0.1–1.0)
Monocytes Relative: 7 % (ref 3–12)
Neutro Abs: 7.6 10*3/uL (ref 1.7–7.7)
Neutrophils Relative %: 69 % (ref 43–77)
Platelets: 398 10*3/uL (ref 150–400)
RBC: 4.3 MIL/uL (ref 3.87–5.11)
RDW: 14.1 % (ref 11.5–15.5)
WBC: 11.1 10*3/uL — ABNORMAL HIGH (ref 4.0–10.5)

## 2014-01-16 LAB — COMPREHENSIVE METABOLIC PANEL
ALT: 7 U/L (ref 0–35)
AST: 11 U/L (ref 0–37)
Albumin: 3.9 g/dL (ref 3.5–5.2)
Alkaline Phosphatase: 68 U/L (ref 39–117)
Anion gap: 11 (ref 5–15)
BUN: 9 mg/dL (ref 6–23)
CO2: 25 mEq/L (ref 19–32)
Calcium: 9.5 mg/dL (ref 8.4–10.5)
Chloride: 101 mEq/L (ref 96–112)
Creatinine, Ser: 0.97 mg/dL (ref 0.50–1.10)
GFR calc Af Amer: 83 mL/min — ABNORMAL LOW (ref >90)
GFR calc non Af Amer: 72 mL/min — ABNORMAL LOW (ref >90)
Glucose, Bld: 99 mg/dL (ref 70–99)
Potassium: 4.1 mEq/L (ref 3.7–5.3)
Sodium: 137 mEq/L (ref 137–147)
Total Bilirubin: 0.3 mg/dL (ref 0.3–1.2)
Total Protein: 7.9 g/dL (ref 6.0–8.3)

## 2014-01-16 LAB — WET PREP, GENITAL
Trich, Wet Prep: NONE SEEN
Yeast Wet Prep HPF POC: NONE SEEN

## 2014-01-16 LAB — URINALYSIS, ROUTINE W REFLEX MICROSCOPIC
Bilirubin Urine: NEGATIVE
Glucose, UA: NEGATIVE mg/dL
Ketones, ur: NEGATIVE mg/dL
Nitrite: NEGATIVE
Protein, ur: NEGATIVE mg/dL
Specific Gravity, Urine: 1.013 (ref 1.005–1.030)
Urobilinogen, UA: 1 mg/dL (ref 0.0–1.0)
pH: 7 (ref 5.0–8.0)

## 2014-01-16 LAB — URINE MICROSCOPIC-ADD ON

## 2014-01-16 LAB — LIPASE, BLOOD: Lipase: 49 U/L (ref 11–59)

## 2014-01-16 LAB — POC URINE PREG, ED: Preg Test, Ur: NEGATIVE

## 2014-01-16 MED ORDER — METRONIDAZOLE 500 MG PO TABS: 500.0000 mg | ORAL_TABLET | Freq: Two times a day (BID) | ORAL | Status: DC

## 2014-01-16 MED ORDER — HYDROCORTISONE 2.5 % RE CREA: TOPICAL_CREAM | RECTAL | Status: DC

## 2014-01-16 NOTE — ED Notes (Signed)
Pt c/o lower abd/pelvic pain that has been going on for a while.  Pt not forsure if she has vaginal discharge or not. Pt doenst answer question when i ask aout having any discharge when wiping or in undergarments.  Pt states that she doesn't believe she is pregnant.

## 2014-01-16 NOTE — ED Provider Notes (Signed)
CSN: [id number]     Arrival date & time 01/16/14  1447 History   First MD Initiated Contact with Patient 01/16/14 1801     Chief Complaint  Patient presents with  . Hip Pain  . Vaginal Discharge     (Consider location/radiation/quality/duration/timing/severity/associated sxs/prior Treatment) Patient is a 43 y.o. female presenting with vaginal discharge. The history is provided by the patient. No language interpreter was used.  Vaginal Discharge Quality:  White Severity:  Moderate Onset quality:  Gradual Timing:  Constant Progression:  Worsening Chronicity:  New Context: not during pregnancy   Relieved by:  Nothing Worsened by:  Nothing tried Ineffective treatments:  None tried Associated symptoms: abdominal pain   Associated symptoms: no dysuria, no genital lesions, no nausea, no urinary incontinence, no vaginal itching and no vomiting     History reviewed. No pertinent past medical history. Past Surgical History  Procedure Laterality Date  . Multiple tooth extractions     Family History  Problem Relation Age of Onset  . Diabetes Mother   . Hypertension Father    History  Substance Use Topics  . Smoking status: Current Every Day Smoker -- 0.50 packs/day for 20 years    Types: Cigarettes  . Smokeless tobacco: Never Used  . Alcohol Use: Yes     Comment: occasionally   OB History   Grav Para Term Preterm Abortions TAB SAB Ect Mult Living                 Review of Systems  Gastrointestinal: Positive for abdominal pain. Negative for nausea and vomiting.  Genitourinary: Positive for vaginal discharge. Negative for bladder incontinence and dysuria.  All other systems reviewed and are negative.     Allergies  Review of patient's allergies indicates no known allergies.  Home Medications   Prior to Admission medications   Medication Sig Start Date End Date Taking? Authorizing Provider  metroNIDAZOLE (FLAGYL) 500 MG tablet Take 1 tablet (500 mg total) by mouth 3  (three) times daily. 03/28/12   Emily Filbert, MD   BP 144/86  Pulse 85  Temp(Src) 98.3 F (36.8 C) (Oral)  Resp 17  SpO2 100%  LMP 12/08/2013 Physical Exam  Nursing note and vitals reviewed. Constitutional: She is oriented to person, place, and time. She appears well-developed and well-nourished.  HENT:  Head: Normocephalic and atraumatic.  Eyes: EOM are normal. Pupils are equal, round, and reactive to light.  Neck: Normal range of motion.  Cardiovascular: Normal rate and normal heart sounds.   Pulmonary/Chest: Effort normal.  Abdominal: Soft. She exhibits no distension.  Genitourinary: Vaginal discharge found.  Thick white vaginal discharge.  Adnexa nontender,  Cervix nontender  Musculoskeletal: Normal range of motion.  Neurological: She is alert and oriented to person, place, and time.  Skin: Skin is warm.  Psychiatric: She has a normal mood and affect.    ED Course  Procedures (including critical care time) Labs Review Labs Reviewed  CBC WITH DIFFERENTIAL - Abnormal; Notable for the following:    WBC 11.1 (*)    All other components within normal limits  COMPREHENSIVE METABOLIC PANEL - Abnormal; Notable for the following:    GFR calc non Af Amer 72 (*)    GFR calc Af Amer 83 (*)    All other components within normal limits  URINALYSIS, ROUTINE W REFLEX MICROSCOPIC - Abnormal; Notable for the following:    Hgb urine dipstick SMALL (*)    Leukocytes, UA TRACE (*)    All  other components within normal limits  URINE MICROSCOPIC-ADD ON - Abnormal; Notable for the following:    Bacteria, UA FEW (*)    All other components within normal limits  LIPASE, BLOOD  POC URINE PREG, ED    Imaging Review No results found.   EKG Interpretation None      MDM  Pt has seen Dr. Hulan Fray in the past.  Pt advised I will treat BV.  She is advised of BV.  Pt advised to follow up with [facility].   Resouce guide given   Final diagnoses:  BV (bacterial vaginosis)        Fransico Meadow, PA-C 01/16/14 1900

## 2014-01-16 NOTE — Discharge Instructions (Signed)
Bacterial Vaginosis °Bacterial vaginosis is a vaginal infection that occurs when the normal balance of bacteria in the vagina is disrupted. It results from an overgrowth of certain bacteria. This is the most common vaginal infection in women of childbearing age. Treatment is important to prevent complications, especially in pregnant women, as it can cause a premature delivery. °CAUSES  °Bacterial vaginosis is caused by an increase in harmful bacteria that are normally present in smaller amounts in the vagina. Several different kinds of bacteria can cause bacterial vaginosis. However, the reason that the condition develops is not fully understood. °RISK FACTORS °Certain activities or behaviors can put you at an increased risk of developing bacterial vaginosis, including: °· Having a new sex partner or multiple sex partners. °· Douching. °· Using an intrauterine device (IUD) for contraception. °Women do not get bacterial vaginosis from toilet seats, bedding, swimming pools, or contact with objects around them. °SIGNS AND SYMPTOMS  °Some women with bacterial vaginosis have no signs or symptoms. Common symptoms include: °· Grey vaginal discharge. °· A fishlike odor with discharge, especially after sexual intercourse. °· Itching or burning of the vagina and vulva. °· Burning or pain with urination. °DIAGNOSIS  °Your health care provider will take a medical history and examine the vagina for signs of bacterial vaginosis. A sample of vaginal fluid may be taken. Your health care provider will look at this sample under a microscope to check for bacteria and abnormal cells. A vaginal pH test may also be done.  °TREATMENT  °Bacterial vaginosis may be treated with antibiotic medicines. These may be given in the form of a pill or a vaginal cream. A second round of antibiotics may be prescribed if the condition comes back after treatment.  °HOME CARE INSTRUCTIONS  °· Only take over-the-counter or prescription medicines as  directed by your health care provider. °· If antibiotic medicine was prescribed, take it as directed. Make sure you finish it even if you start to feel better. °· Do not have sex until treatment is completed. °· Tell all sexual partners that you have a vaginal infection. They should see their health care provider and be treated if they have problems, such as a mild rash or itching. °· Practice safe sex by using condoms and only having one sex partner. °SEEK MEDICAL CARE IF:  °· Your symptoms are not improving after 3 days of treatment. °· You have increased discharge or pain. °· You have a fever. °MAKE SURE YOU:  °· Understand these instructions. °· Will watch your condition. °· Will get help right away if you are not doing well or get worse. °FOR MORE INFORMATION  °Centers for Disease Control and Prevention, Division of STD Prevention: www.cdc.gov/std °American Sexual Health Association (ASHA): www.ashastd.org  °Document Released: 04/27/2005 Document Revised: 02/15/2013 Document Reviewed: 12/07/2012 °ExitCare® Patient Information ©2015 [employer]. This information is not intended to replace advice given to you by your health care provider. Make sure you discuss any questions you have with your health care provider. ° ° °Emergency Department Resource Guide °1) Find a Doctor and Pay Out of Pocket °Although you won't have to find out who is covered by your insurance plan, it is a good idea to ask around and get recommendations. You will then need to call the office and see if the doctor you have chosen will accept you as a new patient and what types of options they offer for patients who are self-pay. Some doctors offer discounts or will set up payment plans   for their patients who do not have insurance, but you will need to ask so you aren't surprised when you get to your appointment. ° °2) Contact Your Local Health Department °Not all health departments have doctors that can see patients for sick visits, but many  do, so it is worth a call to see if yours does. If you don't know where your local health department is, you can check in your phone book. The CDC also has a tool to help you locate your state's health department, and many state websites also have listings of all of their local health departments. ° °3) Find a Walk-in Clinic °If your illness is not likely to be very severe or complicated, you may want to try a walk in clinic. These are popping up all over the country in pharmacies, drugstores, and shopping centers. They're usually staffed by nurse practitioners or physician assistants that have been trained to treat common illnesses and complaints. They're usually fairly quick and inexpensive. However, if you have serious medical issues or chronic medical problems, these are probably not your best option. ° °No Primary Care Doctor: °- Call Health Connect at  832-8000 - they can help you locate a primary care doctor that  accepts your insurance, provides certain services, etc. °- Physician Referral Service- 1-800-533-3463 ° °Chronic Pain Problems: °Organization         Address  Phone   Notes  °[facility]  ([phone]) [phone] Patients need to be referred by their primary care doctor.  ° °Medication Assistance: °Organization         Address  Phone   Notes  °[place] [facility] [address] °[place], [place] [address] (336) 641-8030 --Must be a resident of [place] °-- Must have NO insurance coverage whatsoever (no Medicaid/ Medicare, etc.) °-- The pt. MUST have a primary care doctor that directs their care regularly and follows them in the community °  °[facility]  (866) 331-1348   °United Way  (888) 892-1162   ° °Agencies that provide inexpensive medical care: °Organization         Address  Phone   Notes  °[facility]  (336) 832-8035   °[facility]    (336) 832-7272   °[facility] [address] °[place], [place] [address] (336) 832-4777   °[facility] [address], °[place] (336) 271-4999   °[facility]    (336) 373-0678   °[facility]    (336) 272-1050   °[facility] ° [address], [place] Phone:  (336) 832-4444, Fax:  (336) 832-4440 Hours of Operation:  9 am - 6 pm, M-F.  Also accepts Medicaid/Medicare and self-pay.  ° [facility] ° [address], [place] Phone: (336) [phone], Fax: (336) [phone]. Hours of Operation:  8:30 am - 5:30 pm, M-F.  Also accepts Medicaid and self-pay.  °[facility] [place] [address], [place] Phone: (336) 878-6027   °[facility] [address], [place], [place] (336)723-1848, Ext. 123 Mondays & Thursdays: 7-9 AM.  First 15 patients are seen on a first come, first serve basis. °  ° °Medicaid-accepting [place] Providers: ° °Organization         Address  Phone   Notes  °[facility] [address], [place] (336) [phone] Also accepts self-pay patients.  °  [facility] [id number] [place], [place]  [phone]   [place], [address], [place] [phone]   [facility] [address], [place] [phone]   Lucianne Lei [address], [place]   [phone] Only accepts [place] Access [place] patients after they have their name applied to their card.   Self-Pay (no insurance) in [facility]:  Organization         Address  Phone   Notes  Sickle Cell Patients, [facility] [place] [phone]   [facility] [place] [phone]   [facility] [place]  [place], [place], [place] [phone]   [facility]/Dr. Osei-Bonsu  [address], [place] or  [address], [place] [phone] Phone number for both [place] and [place] locations is the same.  [facility] [address] [place] [phone]   [facility] [place] [address] [place] or [address] [phone]   [facility] [address] [place] [phone], phone; [phone], fax Sees patients 1st and 3rd Saturday of every month.  Must not qualify for public or private insurance (i.e. Medicaid, Medicare, [place] Health Choice, Veterans' Benefits)  Household income should be no more than 200% of the poverty level The clinic cannot treat you if you are pregnant or think you are pregnant  Sexually transmitted diseases are not treated at the clinic.    Dental Care: Organization         Address  Phone  Notes  [facility] [place] [phone] Accepts children up to age 80 who are enrolled in [place] or [place]; pregnant women with a Medicaid card; and children who have applied for Medicaid or [place] Health Choice, but were declined, whose parents can pay a reduced fee at time of service.  [facility]  [address], [place] (865)[phone] Accepts children up to age 38 who are enrolled in [place] or [facility]; pregnant women with a Medicaid card; and children who have applied for Medicaid or  Health Choice, but were declined, whose parents can pay a reduced fee at time of service.  [place] [facility]  [place] [phone] Patients are seen by appointment only. Walk-ins are not accepted. [place] will see patients 14 years of age and older. Monday - Tuesday (8am-5pm) Most Wednesdays (8:30-5pm) $30 per visit, cash only  [facility] Adult Dental Access PROGRAM  [address], [facility] [phone] Patients are seen by appointment only. Walk-ins are not accepted. [place] will see patients 53 years of age and older. One Wednesday Evening (Monthly: Volunteer Based).  $30 per visit, cash only  [place]  [phone] for adults; Children under age 71, call Graduate Pediatric Dentistry at [phone]. Children aged 19-14, please call [phone] to request a pediatric application.  Dental services are provided in all areas of dental care including fillings, crowns and bridges, complete and partial dentures, implants, gum treatment, root canals, and extractions. Preventive care is also provided. Treatment is provided to both adults  and children. Patients are selected via a lottery and there is often a waiting list.   [facility] [address], [place]  [phone] [email or web address]   [facility] [address] [place], [place] [phone], Ext. [phone] Second and Fourth Thursday of each month, opens at 6:30 AM; Clinic ends at 9 AM.  Patients are seen on a first-come first-served basis, and a limited number are seen during each clinic.   [facility]  [address] Hillard Danker [place], [place] [phone]   Eligibility Requirements You must have lived in [place], [place], or [place] counties for at least the last three months.   You cannot be eligible for state or federal sponsored [employer], including [employer], [place], or [employer].   You generally cannot be eligible for healthcare insurance through your employer.    How to apply: Eligibility screenings are held every Tuesday and Wednesday afternoon from 1:00 pm until 4:00 pm. You do not need an appointment for the interview!  [facility] [address] [place], [place]   [place]  [place] Department  [place]  [phone]    Behavioral Health Resources in the Community: Intensive Outpatient Programs Organization         Address  Phone  Notes  [facility] [place]. [address] [place], [place] [phone]   [facility] Outpatient [address], [place], [place]   [facility] [address], [place], [place]   [facility] [address],  [place], [place] or [phone]   Substance Abuse Resources Organization         Address  Phone  Notes  [facility] Drug Services  4022002898   [place]  [phone]   [facility] [place]   [place]ester  [phone]   Residential & Outpatient Substance Abuse Program  (306)456-9434   Psychological Services Organization         Address  Phone  Notes  [facility] [place]  [place]  (418)[phone]   [facility] [address] [place] or [phone]    Mobile Crisis Teams Organization         Address  Phone  Notes  [facility], Mobile Crisis Care Unit  [phone]   [facility]  [address] [place], [place]   [place] [address] [place] [phone]    Self-Help/Support Groups Organization         Address  Phone             Notes  [place]. of [place] - variety of support groups  [place] Call for more information  Narcotics Anonymous (NA), [facility] [address], [employer]   2 meetings at this location   Special educational needs teacher         Address  Phone  Notes  [facility] [place],    [place]  [phone]   [facility]  [address] [place] [id number], [place], [place]   [place], [place] [phone]  Admissions: 8am-3pm M-F  Incentives Substance [place] [address],    [place], [place] [phone]   [facility] [address], [place], [place] [phone]   °[facility] [address]  °[place], [place] [phone]   °[facility] - Intensive Outpatient [address], [place], [place] [phone]   °[facility] [address]  °[place], [place] 1-877-615-2722 or [phone]   °[facility] ([facility]) [address] [place], [place] [phone] Accepts Medicaid  °[facility] [address]  °[place] 1-800-659-3381 Substance Abuse/Addiction Treatment  ° °[place] Behavioral Health Resources °Organization         Address  Phone  Notes  °[facility]  (888) [phone]   °Julie Brannon, PhD [address] [place], [place]   (336) 349-5553 or (336) 951-0000   °[facility]   [address] °[place], [place] (336) 349-4454   °[facility] [address], [place], [place] (336) [phone] Insurance/Medicaid/sponsorship through [facility]  °[facility] [address]                                    [place], [place] (336) [phone] Therapy/tele-psych/case  °[facility] [address]  ° [place],  (336) [phone]    °Dr. Arfeen  (336) [phone]   °[facility] [place]  [employer] [place] [facility] 1) [address], [place] °2) [address], [place] °3)  [address], [place] (336) [phone] °(336) 342-7768 ° °(336) 342-8140   °[place] Child Abuse Hotline (336) 342-1394 or (336) 342-3537 (After Hours)    ° °  °

## 2014-01-16 NOTE — ED Provider Notes (Signed)
Medical screening examination/treatment/procedure(s) were performed by non-physician practitioner and as supervising physician I was immediately available for consultation/collaboration.   EKG Interpretation None        Orpah Greek, MD 01/16/14 [phone]

## 2014-01-17 LAB — GC/CHLAMYDIA PROBE AMP
CT Probe RNA: NEGATIVE
GC Probe RNA: NEGATIVE

## 2014-02-23 ENCOUNTER — Other Ambulatory Visit: Payer: Self-pay

## 2014-06-18 ENCOUNTER — Encounter (HOSPITAL_COMMUNITY): Payer: Self-pay

## 2014-06-18 ENCOUNTER — Emergency Department (HOSPITAL_COMMUNITY)
Admission: EM | Admit: 2014-06-18 | Discharge: 2014-06-18 | Disposition: A | Discharge status: Home / Self Care | Payer: No Typology Code available for payment source | Attending: Emergency Medicine | Admitting: Emergency Medicine

## 2014-06-18 DIAGNOSIS — Z792 Long term (current) use of antibiotics: Secondary | ICD-10-CM | POA: Insufficient documentation

## 2014-06-18 DIAGNOSIS — R599 Enlarged lymph nodes, unspecified: Secondary | ICD-10-CM | POA: Insufficient documentation

## 2014-06-18 DIAGNOSIS — Z880 Allergy status to penicillin: Secondary | ICD-10-CM | POA: Diagnosis not present

## 2014-06-18 DIAGNOSIS — K047 Periapical abscess without sinus: Secondary | ICD-10-CM | POA: Diagnosis not present

## 2014-06-18 DIAGNOSIS — Z720 Tobacco use: Secondary | ICD-10-CM | POA: Diagnosis not present

## 2014-06-18 DIAGNOSIS — Z7952 Long term (current) use of systemic steroids: Secondary | ICD-10-CM | POA: Diagnosis not present

## 2014-06-18 DIAGNOSIS — K029 Dental caries, unspecified: Secondary | ICD-10-CM | POA: Diagnosis not present

## 2014-06-18 DIAGNOSIS — K088 Other specified disorders of teeth and supporting structures: Secondary | ICD-10-CM | POA: Diagnosis present

## 2014-06-18 MED ORDER — TRAMADOL HCL 50 MG PO TABS: 50.0000 mg | ORAL_TABLET | Freq: Four times a day (QID) | ORAL | Status: DC | PRN

## 2014-06-18 MED ORDER — CLINDAMYCIN HCL 300 MG PO CAPS
300.0000 mg | ORAL_CAPSULE | Freq: Once | ORAL | Status: AC
  Administered 2014-06-18: 300 mg via ORAL
  Filled 2014-06-18: qty 1

## 2014-06-18 MED ORDER — TRAMADOL HCL 50 MG PO TABS
50.0000 mg | ORAL_TABLET | Freq: Once | ORAL | Status: AC
  Administered 2014-06-18: 50 mg via ORAL
  Filled 2014-06-18: qty 1

## 2014-06-18 MED ORDER — CLINDAMYCIN HCL 300 MG PO CAPS: 300.0000 mg | ORAL_CAPSULE | Freq: Three times a day (TID) | ORAL | Status: DC

## 2014-06-18 NOTE — Discharge Instructions (Signed)
Abscessed Tooth An abscessed tooth is an infection around your tooth. It may be caused by holes or damage to the tooth (cavity) or a dental disease. An abscessed tooth causes mild to very bad pain in and around the tooth. See your dentist right away if you have tooth or gum pain. HOME CARE  Take your medicine as told. Finish it even if you start to feel better.  Do not drive after taking pain medicine.  Rinse your mouth (gargle) often with salt water ( teaspoon salt in 8 ounces of warm water).  Do not apply heat to the outside of your face. GET HELP RIGHT AWAY IF:   You have a temperature by mouth above 102 F (38.9 C), not controlled by medicine.  You have chills and a very bad headache.  You have problems breathing or swallowing.  Your mouth will not open.  You develop puffiness (swelling) on the neck or around the eye.  Your pain is not helped by medicine.  Your pain is getting worse instead of better. MAKE SURE YOU:   Understand these instructions.  Will watch your condition.  Will get help right away if you are not doing well or get worse. Document Released: 10/14/2007 Document Revised: 07/20/2011 Document Reviewed: 08/05/2010 [facility] Patient Information 2015 [place], [place]. This information is not intended to replace advice given to you by your health care provider. Make sure you discuss any questions you have with your health care provider. Take the antibiotic as directed.  Apply warm compresses, to your jawline, several times a day.  Make sure to keep her dental appointment as scheduled on Wednesday

## 2014-06-18 NOTE — ED Provider Notes (Signed)
CSN: [id number]     Arrival date & time 06/18/14  2223 History  This chart was scribed for non-physician practitioner, Manuela Neptune, NP-C working with Pamella Pert, MD by Einar Pheasant, ED scribe. This patient was seen in room WTR8/WTR8 and the patient's care was started at 10:39 PM.    Chief Complaint  Patient presents with  . Dental Pain   The history is provided by the patient and medical records. No language interpreter was used.   HPI Comments: Lori Perkins is a 44 y.o. female who presents to the Emergency Department complaining of left lower dental pain that started 3 days ago. Pt believes that a filling fell out and may be causing an infection. She endorses associated facial swelling on the left which started Today. Pt states that she has a scheduled dental appointment on Wednesday. Reports taking Ibuprofen, OTC pain medication, and applying warm compresses. Pt denies any fever, neck pain, sore throat, visual disturbance, CP, cough, SOB, abdominal pain, nausea, emesis, diarrhea, urinary symptoms, back pain, HA, weakness, numbness and rash as associated symptoms.    History reviewed. No pertinent past medical history. Past Surgical History  Procedure Laterality Date  . Multiple tooth extractions     Family History  Problem Relation Age of Onset  . Diabetes Mother   . Hypertension Father    History  Substance Use Topics  . Smoking status: Current Every Day Smoker -- 0.50 packs/day for 20 years    Types: Cigarettes  . Smokeless tobacco: Never Used  . Alcohol Use: Yes     Comment: occasionally   OB History    No data available     Review of Systems  Constitutional: Negative for fever.  HENT: Positive for dental problem. Negative for facial swelling and sore throat.   Respiratory: Negative for shortness of breath.   Musculoskeletal: Negative for neck pain and neck stiffness.   Allergies  Penicillins  Home Medications   Prior to Admission medications   Medication  Sig Start Date End Date Taking? Authorizing Provider  acetaminophen (TYLENOL) 500 MG tablet Take 500 mg by mouth every 6 (six) hours as needed for mild pain.    Historical Provider, MD  clindamycin (CLEOCIN) 300 MG capsule Take 1 capsule (300 mg total) by mouth 3 (three) times daily. 06/18/14   Garald Balding, NP  hydrocortisone (ANUSOL-HC) 2.5 % rectal cream Apply rectally 2 times daily 01/16/14   Fransico Meadow, PA-C  metroNIDAZOLE (FLAGYL) 500 MG tablet Take 1 tablet (500 mg total) by mouth 2 (two) times daily. 01/16/14   Fransico Meadow, PA-C  traMADol (ULTRAM) 50 MG tablet Take 1 tablet (50 mg total) by mouth every 6 (six) hours as needed. 06/18/14   Garald Balding, NP   BP 162/82 mmHg  Pulse 99  Temp(Src) 98.3 F (36.8 C) (Oral)  Resp 14  SpO2 99%  LMP 06/03/2014 (Approximate)   Physical Exam  Constitutional: She is oriented to person, place, and time. She appears well-developed and well-nourished.  HENT:  Head: Normocephalic and atraumatic.  Mouth/Throat:    Neck:    Cardiovascular: Normal rate.   Pulmonary/Chest: Effort normal.  Lymphadenopathy:    She has cervical adenopathy.  Neurological: She is alert and oriented to person, place, and time.  Skin: Skin is warm and dry.  Psychiatric: She has a normal mood and affect.  Nursing note and vitals reviewed.   ED Course  Procedures (including critical care time)  DIAGNOSTIC STUDIES: Oxygen Saturation is  99% on RA, normal by my interpretation.    COORDINATION OF CARE: 10:42 PM- Pt advised of plan for treatment and pt agrees.  Labs Review Labs Reviewed - No data to display  Imaging Review No results found.   EKG Interpretation None      MDM  Patient has been started on clindamycin due to a vague penicillin allergy.  She does have an appointment with dentist on Wednesday.  She is also given a prescription for Ultram for pain control and instructed to apply warm compresses several times a day Final diagnoses:  Dental  abscess    I personally performed the services described in this documentation, which was scribed in my presence. The recorded information has been reviewed and is accurate.   Garald Balding, NP 06/18/14 0459  Pamella Pert, MD 06/19/14 [phone]

## 2014-06-18 NOTE — ED Notes (Signed)
Pt presents with c/o dental pain on the left lower side. Pt reports she believes a filling fell out and the area became infected. Pt has some facial swelling to her left lower jaw.

## 2014-08-16 ENCOUNTER — Emergency Department (HOSPITAL_COMMUNITY)
Admission: EM | Admit: 2014-08-16 | Discharge: 2014-08-16 | Disposition: A | Discharge status: Home / Self Care | Payer: No Typology Code available for payment source | Attending: Emergency Medicine | Admitting: Emergency Medicine

## 2014-08-16 ENCOUNTER — Encounter (HOSPITAL_COMMUNITY): Payer: Self-pay

## 2014-08-16 DIAGNOSIS — B379 Candidiasis, unspecified: Secondary | ICD-10-CM

## 2014-08-16 DIAGNOSIS — Z720 Tobacco use: Secondary | ICD-10-CM | POA: Insufficient documentation

## 2014-08-16 DIAGNOSIS — N760 Acute vaginitis: Secondary | ICD-10-CM | POA: Diagnosis not present

## 2014-08-16 DIAGNOSIS — Z3202 Encounter for pregnancy test, result negative: Secondary | ICD-10-CM | POA: Diagnosis not present

## 2014-08-16 DIAGNOSIS — B373 Candidiasis of vulva and vagina: Secondary | ICD-10-CM | POA: Insufficient documentation

## 2014-08-16 DIAGNOSIS — Z880 Allergy status to penicillin: Secondary | ICD-10-CM | POA: Diagnosis not present

## 2014-08-16 DIAGNOSIS — B9689 Other specified bacterial agents as the cause of diseases classified elsewhere: Secondary | ICD-10-CM

## 2014-08-16 DIAGNOSIS — N3001 Acute cystitis with hematuria: Secondary | ICD-10-CM | POA: Diagnosis not present

## 2014-08-16 DIAGNOSIS — R1031 Right lower quadrant pain: Secondary | ICD-10-CM | POA: Diagnosis present

## 2014-08-16 LAB — COMPREHENSIVE METABOLIC PANEL
ALT: 11 U/L (ref 0–35)
AST: 18 U/L (ref 0–37)
Albumin: 4 g/dL (ref 3.5–5.2)
Alkaline Phosphatase: 57 U/L (ref 39–117)
Anion gap: 12 (ref 5–15)
BUN: 12 mg/dL (ref 6–23)
CO2: 25 mmol/L (ref 19–32)
Calcium: 9.1 mg/dL (ref 8.4–10.5)
Chloride: 104 mmol/L (ref 96–112)
Creatinine, Ser: 0.82 mg/dL (ref 0.50–1.10)
GFR calc Af Amer: >90 mL/min (ref >90)
GFR calc non Af Amer: 87 mL/min — ABNORMAL LOW (ref >90)
Glucose, Bld: 122 mg/dL — ABNORMAL HIGH (ref 70–99)
Potassium: 3.3 mmol/L — ABNORMAL LOW (ref 3.5–5.1)
Sodium: 141 mmol/L (ref 135–145)
Total Bilirubin: 0.7 mg/dL (ref 0.3–1.2)
Total Protein: 7.4 g/dL (ref 6.0–8.3)

## 2014-08-16 LAB — URINALYSIS, ROUTINE W REFLEX MICROSCOPIC
Glucose, UA: NEGATIVE mg/dL
Ketones, ur: NEGATIVE mg/dL
Leukocytes, UA: NEGATIVE
Nitrite: POSITIVE — AB
Protein, ur: NEGATIVE mg/dL
Specific Gravity, Urine: 1.03 (ref 1.005–1.030)
Urobilinogen, UA: 0.2 mg/dL (ref 0.0–1.0)
pH: 6 (ref 5.0–8.0)

## 2014-08-16 LAB — CBC WITH DIFFERENTIAL/PLATELET
Basophils Absolute: 0 10*3/uL (ref 0.0–0.1)
Basophils Relative: 0 % (ref 0–1)
Eosinophils Absolute: 0 10*3/uL (ref 0.0–0.7)
Eosinophils Relative: 0 % (ref 0–5)
HCT: 40.7 % (ref 36.0–46.0)
Hemoglobin: 13.2 g/dL (ref 12.0–15.0)
Lymphocytes Relative: 22 % (ref 12–46)
Lymphs Abs: 2.1 10*3/uL (ref 0.7–4.0)
MCH: 31.8 pg (ref 26.0–34.0)
MCHC: 32.4 g/dL (ref 30.0–36.0)
MCV: 98.1 fL (ref 78.0–100.0)
Monocytes Absolute: 0.7 10*3/uL (ref 0.1–1.0)
Monocytes Relative: 8 % (ref 3–12)
Neutro Abs: 6.5 10*3/uL (ref 1.7–7.7)
Neutrophils Relative %: 70 % (ref 43–77)
Platelets: 344 10*3/uL (ref 150–400)
RBC: 4.15 MIL/uL (ref 3.87–5.11)
RDW: 13.5 % (ref 11.5–15.5)
WBC: 9.3 10*3/uL (ref 4.0–10.5)

## 2014-08-16 LAB — URINE MICROSCOPIC-ADD ON

## 2014-08-16 LAB — WET PREP, GENITAL
Trich, Wet Prep: NONE SEEN
Yeast Wet Prep HPF POC: NONE SEEN

## 2014-08-16 LAB — LIPASE, BLOOD: Lipase: 25 U/L (ref 11–59)

## 2014-08-16 LAB — PREGNANCY, URINE: Preg Test, Ur: NEGATIVE

## 2014-08-16 MED ORDER — NITROFURANTOIN MONOHYD MACRO 100 MG PO CAPS: 100.0000 mg | ORAL_CAPSULE | Freq: Two times a day (BID) | ORAL | Status: DC

## 2014-08-16 MED ORDER — AZITHROMYCIN 250 MG PO TABS
1000.0000 mg | ORAL_TABLET | Freq: Once | ORAL | Status: AC
  Administered 2014-08-16: 1000 mg via ORAL
  Filled 2014-08-16: qty 4

## 2014-08-16 MED ORDER — LIDOCAINE HCL 1 % IJ SOLN
INTRAMUSCULAR | Status: AC
  Administered 2014-08-16: 19:00:00
  Filled 2014-08-16: qty 20

## 2014-08-16 MED ORDER — IBUPROFEN 800 MG PO TABS
800.0000 mg | ORAL_TABLET | Freq: Once | ORAL | Status: AC
  Administered 2014-08-16: 800 mg via ORAL
  Filled 2014-08-16: qty 1

## 2014-08-16 MED ORDER — CEFTRIAXONE SODIUM 250 MG IJ SOLR
250.0000 mg | Freq: Once | INTRAMUSCULAR | Status: AC
  Administered 2014-08-16: 250 mg via INTRAMUSCULAR
  Filled 2014-08-16: qty 250

## 2014-08-16 MED ORDER — METRONIDAZOLE 500 MG PO TABS: 500.0000 mg | ORAL_TABLET | Freq: Two times a day (BID) | ORAL | Status: DC

## 2014-08-16 NOTE — ED Notes (Signed)
Lower abdominal pain since Sunday.  Vomited x 1 yesterday.  No fever.  No change in urination.

## 2014-08-16 NOTE — Discharge Instructions (Signed)
Bacterial Vaginosis °Bacterial vaginosis is a vaginal infection that occurs when the normal balance of bacteria in the vagina is disrupted. It results from an overgrowth of certain bacteria. This is the most common vaginal infection in women of childbearing age. Treatment is important to prevent complications, especially in pregnant women, as it can cause a premature delivery. °CAUSES  °Bacterial vaginosis is caused by an increase in harmful bacteria that are normally present in smaller amounts in the vagina. Several different kinds of bacteria can cause bacterial vaginosis. However, the reason that the condition develops is not fully understood. °RISK FACTORS °Certain activities or behaviors can put you at an increased risk of developing bacterial vaginosis, including: °· Having a new sex partner or multiple sex partners. °· Douching. °· Using an intrauterine device (IUD) for contraception. °Women do not get bacterial vaginosis from toilet seats, bedding, swimming pools, or contact with objects around them. °SIGNS AND SYMPTOMS  °Some women with bacterial vaginosis have no signs or symptoms. Common symptoms include: °· Grey vaginal discharge. °· A fishlike odor with discharge, especially after sexual intercourse. °· Itching or burning of the vagina and vulva. °· Burning or pain with urination. °DIAGNOSIS  °Your health care provider will take a medical history and examine the vagina for signs of bacterial vaginosis. A sample of vaginal fluid may be taken. Your health care provider will look at this sample under a microscope to check for bacteria and abnormal cells. A vaginal pH test may also be done.  °TREATMENT  °Bacterial vaginosis may be treated with antibiotic medicines. These may be given in the form of a pill or a vaginal cream. A second round of antibiotics may be prescribed if the condition comes back after treatment.  °HOME CARE INSTRUCTIONS  °· Only take over-the-counter or prescription medicines as  directed by your health care provider. °· If antibiotic medicine was prescribed, take it as directed. Make sure you finish it even if you start to feel better. °· Do not have sex until treatment is completed. °· Tell all sexual partners that you have a vaginal infection. They should see their health care provider and be treated if they have problems, such as a mild rash or itching. °· Practice safe sex by using condoms and only having one sex partner. °SEEK MEDICAL CARE IF:  °· Your symptoms are not improving after 3 days of treatment. °· You have increased discharge or pain. °· You have a fever. °MAKE SURE YOU:  °· Understand these instructions. °· Will watch your condition. °· Will get help right away if you are not doing well or get worse. °FOR MORE INFORMATION  °Centers for Disease Control and Prevention, Division of STD Prevention: www.cdc.gov/std °American Sexual Health Association (ASHA): www.ashastd.org  °Document Released: 04/27/2005 Document Revised: 02/15/2013 Document Reviewed: 12/07/2012 °ExitCare® Patient Information ©2015 [employer]. This information is not intended to replace advice given to you by your health care provider. Make sure you discuss any questions you have with your health care provider. ° ° °Emergency Department Resource Guide °1) Find a Doctor and Pay Out of Pocket °Although you won't have to find out who is covered by your insurance plan, it is a good idea to ask around and get recommendations. You will then need to call the office and see if the doctor you have chosen will accept you as a new patient and what types of options they offer for patients who are self-pay. Some doctors offer discounts or will set up payment plans   for their patients who do not have insurance, but you will need to ask so you aren't surprised when you get to your appointment. ° °2) Contact Your Local Health Department °Not all health departments have doctors that can see patients for sick visits, but many  do, so it is worth a call to see if yours does. If you don't know where your local health department is, you can check in your phone book. The CDC also has a tool to help you locate your state's health department, and many state websites also have listings of all of their local health departments. ° °3) Find a Walk-in Clinic °If your illness is not likely to be very severe or complicated, you may want to try a walk in clinic. These are popping up all over the country in pharmacies, drugstores, and shopping centers. They're usually staffed by nurse practitioners or physician assistants that have been trained to treat common illnesses and complaints. They're usually fairly quick and inexpensive. However, if you have serious medical issues or chronic medical problems, these are probably not your best option. ° °No Primary Care Doctor: °- Call Health Connect at  832-8000 - they can help you locate a primary care doctor that  accepts your insurance, provides certain services, etc. °- Physician Referral Service- 1-800-533-3463 ° °Chronic Pain Problems: °Organization         Address  Phone   Notes  °[facility]  ([phone] Patients need to be referred by their primary care doctor.  ° °Medication Assistance: °Organization         Address  Phone   Notes  °[place] [facility] [address] °[place], [place] [address] (336) [phone] --Must be a resident of [place] °-- Must have NO insurance coverage whatsoever (no Medicaid/ Medicare, etc.) °-- The pt. MUST have a primary care doctor that directs their care regularly and follows them in the community °  °[facility]  (866) 331-1348   °[employer]  (888) 892-1162   ° °Agencies that provide inexpensive medical care: °Organization         Address  Phone   Notes  °[facility]  ([phone]) [phone]   °[facility]    ([phone]) 832-7272   °[facility] [address] °[place], [place] [address] (336) [phone]   °[facility] [address], °[place] ([phone]) [phone]   °Planned Parenthood    (336) 373-0678   °[facility]    (336) 272-1050   °[facility] ° [address], [place] Phone:  (336) 832-4444, Fax:  (336) [phone] Hours of Operation:  9 am - 6 pm, M-F.  Also accepts Medicaid/Medicare and self-pay.  °[facility] ° [address], [place] Phone: (336) [phone], Fax: ([phone]) [phone]. Hours of Operation:  8:30 am - 5:30 pm, M-F.  Also accepts Medicaid and self-pay.  °[facility] [place] [address], [place] Phone: (336) 878-6027   °[facility] [address], [place], [place] (336)723-1848, Ext. 123 Mondays & Thursdays: 7-9 AM.  First 15 patients are seen on a first come, first serve basis. °  ° °Medicaid-accepting [place] Providers: ° °Organization         Address  Phone   Notes  °[facility] [address], [place] ([phone]) [phone] Also accepts self-pay patients.  °  [facility] [id number] [place], [place]  [phone]   [place], [address], [place] [phone]   [facility] [address], [place] [phone]   Lucianne Lei [address], [place]   [phone] Only accepts [place] Access [place] patients after they have their name applied to their card.   Self-Pay (no insurance) in [facility]:  Organization         Address  Phone   Notes  Sickle Cell Patients, [facility] [place] [phone]   [facility] [place] [phone]   [facility] [place]  [place], [place], [place] [phone]   [facility]/Dr. Osei-Bonsu  [address], [place] or  [address], [place] [phone] Phone number for both [place] and [place] locations is the same.  [facility] [address] [place] [phone]   [facility] [place] [address] [place] or [address] [phone]   [facility] [address] [place] [phone], phone; [phone], fax Sees patients 1st and 3rd Saturday of every month.  Must not qualify for public or private insurance (i.e. Medicaid, Medicare, [place] Health Choice, Veterans' Benefits)  Household income should be no more than 200% of the poverty level The clinic cannot treat you if you are pregnant or think you are pregnant  Sexually transmitted diseases are not treated at the clinic.    Dental Care: Organization         Address  Phone  Notes  [facility] [place] [phone] Accepts children up to age 80 who are enrolled in [place] or [place]; pregnant women with a Medicaid card; and children who have applied for Medicaid or [place] Health Choice, but were declined, whose parents can pay a reduced fee at time of service.  [facility]  [address], [place] [phone] Accepts children up to age 38 who are enrolled in [place] or [facility]; pregnant women with a Medicaid card; and children who have applied for Medicaid or [place] Health Choice, but were declined, whose parents can pay a reduced fee at time of service.  [place] [facility]  [place] [phone] Patients are seen by appointment only. Walk-ins are not accepted. [place] will see patients 14 years of age and older. Monday - Tuesday (8am-5pm) Most Wednesdays (8:30-5pm) $30 per visit, cash only  [facility] Adult Dental Access PROGRAM  [address], [facility] [phone] Patients are seen by appointment only. Walk-ins are not accepted. [place] will see patients 53 years of age and older. One Wednesday Evening (Monthly: Volunteer Based).  $30 per visit, cash only  [place]  [phone] for adults; Children under age 71, call Graduate Pediatric Dentistry at [phone]. Children aged 19-14, please call [phone] to request a pediatric application.  Dental services are provided in all areas of dental care including fillings, crowns and bridges, complete and partial dentures, implants, gum treatment, root canals, and extractions. Preventive care is also provided. Treatment is provided to both adults  and children. Patients are selected via a lottery and there is often a waiting list.   [facility] [address], [place]  [phone] [email or web address]   [facility] [address] [place], [place] [phone], Ext. [phone] Second and Fourth Thursday of each month, opens at 6:30 AM; Clinic ends at 9 AM.  Patients are seen on a first-come first-served basis, and a limited number are seen during each clinic.   [facility]  [address] Hillard Danker [place], [place] [phone]   Eligibility Requirements You must have lived in [place], [place], or [place] counties for at least the last three months.   You cannot be eligible for state or federal sponsored [employer], including [employer], [place], or [employer].   You generally cannot be eligible for healthcare insurance through your employer.    How to apply: Eligibility screenings are held every Tuesday and Wednesday afternoon from 1:00 pm until 4:00 pm. You do not need an appointment for the interview!  [facility] [address] [place], [place]   [place]  [place] Department  [place]  [phone]    Behavioral Health Resources in the Community: Intensive Outpatient Programs Organization         Address  Phone  Notes  [facility] [place]. [address] [place], [place] [phone]   [facility] Outpatient [address], [place], [place]   ADS: [facility] [address], [place], [place]   [facility] [address],  [place], [place] or [phone]   Substance Abuse Resources Organization         Address  Phone  Notes  Alcohol and Drug Services  4022002898   [place]  [phone]   [facility] [place]   Chinita Pester  [phone]   Residential & Outpatient Substance Abuse Program  ([phone]   Psychological Services Organization         Address  Phone  Notes  [facility] [place]  [place]  [phone]   [place] [address] [place] or [phone]    Mobile Crisis Teams Organization         Address  Phone  Notes  [facility], Mobile Crisis Care Unit  [phone]   [facility]  [address] [place], [place]   [place] [address] [place] [phone]    Self-Help/Support Groups Organization         Address  Phone             Notes  [place]. of [place] - variety of support groups  [place] Call for more information  Narcotics Anonymous (NA), [facility] [address], [employer] H. [place]  2 meetings at this location   Special educational needs teacher         Address  Phone  Notes  [facility] [place],    [place]  [phone]   [facility]  [address] [place] [id number], [place], [place]   [place], [place] [phone]  Admissions: 8am-3pm M-F  Incentives Substance [place] [address],    [place], [place] [phone]   [facility]  [facility] [address] [place], [place], [place]   The [place].,  [place], [place]   [facility] - Intensive Outpatient [address] Kristeen Mans [address], [place], [place]   [facility] ([place] [address]  [place], [place] [phone] or [phone]   [facility] ([facility]) [address], [place], [place] Accepts Medicaid  [facility] [place] [address]  [place] [phone] Substance Abuse/Addiction Treatment   [facility] [place] Organization         Address  Phone  Notes  [facility]  [phone]   Domenic Schwab, PhD [address] Arlis [place], [place]   [phone] or [phone]   [place], [place] [phone]   [facility] [address] [place], [place] [phone] Insurance/Medicaid/sponsorship through [facility] and Families [address] [place]                                    [place], [place] [phone] [place] [address] [place], [place] [phone]    Dr. Adele Schilder  [phone]   [facility] Dept. 1) [address], [place] 2) [place] 3)  [place] [address], [place] [phone]  [phone]   [place] [phone] or [phone] (After Hours)     Please take medication as directed, for entire course of therapy. For worsening signs or symptoms and seek care if any present. Please follow-up with primary care provider as listed above.

## 2014-08-16 NOTE — ED Provider Notes (Signed)
CSN: [id number]     Arrival date & time 08/16/14  1556 History   First MD Initiated Contact with Patient 08/16/14 1726     Chief Complaint  Patient presents with  . Abdominal Pain     HPI   44 year old female presents with 5 days of bilateral lower cramping abdominal pain. Patient reports 5 days ago she felt a cramping sensation that lasted for approximately 15 minutes. Dates that these happen frequently throughout the day and resolve on their own. She denies any radiation of symptoms. Patient denies dysuria, discharge, or changes in urinary or bowel habits or frequencies she denies fever, headache, chest pain, swelling of the lower extremities. She does note a foul vaginal odor for the past few weeks. She reports one episode of nausea and vomiting yesterday. Patient reports her last normal menstrual cycle was 08/06/2014. She is sexually active and does not use protection. Denies GU symptoms in partners.   History reviewed. No pertinent past medical history. Past Surgical History  Procedure Laterality Date  . Multiple tooth extractions     Family History  Problem Relation Age of Onset  . Diabetes Mother   . Hypertension Father    History  Substance Use Topics  . Smoking status: Current Every Day Smoker -- 0.50 packs/day for 20 years    Types: Cigarettes  . Smokeless tobacco: Never Used  . Alcohol Use: Yes     Comment: occasionally   OB History    No data available     Review of Systems  All other systems reviewed and are negative.   Allergies  Penicillins  Home Medications   Prior to Admission medications   Medication Sig Start Date End Date Taking? Authorizing Provider  acetaminophen (TYLENOL) 500 MG tablet Take 500 mg by mouth every 6 (six) hours as needed for mild pain.   Yes Historical Provider, MD  clindamycin (CLEOCIN) 300 MG capsule Take 1 capsule (300 mg total) by mouth 3 (three) times daily. Patient not taking: Reported on 08/16/2014 06/18/14   Junius Creamer, NP   hydrocortisone (ANUSOL-HC) 2.5 % rectal cream Apply rectally 2 times daily Patient not taking: Reported on 08/16/2014 01/16/14   Fransico Meadow, PA-C  metroNIDAZOLE (FLAGYL) 500 MG tablet Take 1 tablet (500 mg total) by mouth 2 (two) times daily. Patient not taking: Reported on 08/16/2014 01/16/14   Fransico Meadow, PA-C  traMADol (ULTRAM) 50 MG tablet Take 1 tablet (50 mg total) by mouth every 6 (six) hours as needed. Patient not taking: Reported on 08/16/2014 06/18/14   Junius Creamer, NP   BP 175/103 mmHg  Pulse 90  Temp(Src) 98.4 F (36.9 C) (Oral)  Resp 20  SpO2 100%  LMP 08/06/2014 Physical Exam  Constitutional: She is oriented to person, place, and time. She appears well-developed and well-nourished.  HENT:  Head: Normocephalic and atraumatic.  Eyes: Pupils are equal, round, and reactive to light.  Neck: Normal range of motion. Neck supple. No JVD present. No tracheal deviation present. No thyromegaly present.  Cardiovascular: Normal rate, regular rhythm, normal heart sounds and intact distal pulses.  Exam reveals no gallop and no friction rub.   No murmur heard. Pulmonary/Chest: Effort normal and breath sounds normal. No stridor. No respiratory distress. She has no wheezes. She has no rales. She exhibits no tenderness.  Abdominal: Soft. Bowel sounds are normal. She exhibits no distension. There is tenderness in the suprapubic area. There is no CVA tenderness.  Minimal suprapubic tenderness, Soft otherwise nontender. No Murphy's,  McBurney's, guarding, rigidity  Genitourinary: Vagina normal and uterus normal. There is no rash, tenderness, lesion or injury on the right labia. There is no rash, tenderness, lesion or injury on the left labia. Cervix exhibits discharge. Cervix exhibits no motion tenderness and no friability. Right adnexum displays no mass, no tenderness and no fullness. Left adnexum displays no mass, no tenderness and no fullness.  Musculoskeletal: Normal range of motion.   Lymphadenopathy:    She has no cervical adenopathy.  Neurological: She is alert and oriented to person, place, and time. Coordination normal.  Skin: Skin is warm and dry.  Psychiatric: She has a normal mood and affect. Her behavior is normal. Judgment and thought content normal.  Nursing note and vitals reviewed.   ED Course  Procedures (including critical care time) Labs Review Labs Reviewed  COMPREHENSIVE METABOLIC PANEL - Abnormal; Notable for the following:    Potassium 3.3 (*)    Glucose, Bld 122 (*)    GFR calc non Af Amer 87 (*)    All other components within normal limits  CBC WITH DIFFERENTIAL/PLATELET  LIPASE, BLOOD  PREGNANCY, URINE  URINALYSIS, ROUTINE W REFLEX MICROSCOPIC    Imaging Review No results found.   EKG Interpretation None      MDM   Final diagnoses:  Acute cystitis with hematuria  BV (bacterial vaginosis)    Labs: Pregnancy, CBC, CMP, lipase- no significant findings  Urinalysis- amber, cloudy, moderate Hgb, small bili, nitrite positive- add-on bacteria many  Imaging:none  Consults:none  Therapeutics: Ceftriaxone, Azithromycin,   Assessment: Bacterial Vaginosis, Yeast, UTI  Plan: Pt's presentation and exam likely multifactorial. She was treated with the above and sent home with metronidazole and macrobid. She is instructed to complete complete treatment course and advised to avoid alcohol use with medication. She will be notified of pending results. She is encouraged to practice safe sex. Pt advised to follow-up with PCP for further evaluation and management of diagnosis listed above. She was given return instructions in the event of new or worsening signs and symptoms. Pt understood and agreed to the plan.      Okey Regal, PA-C 08/17/14 [place], MD 08/20/14 [phone]

## 2014-08-17 LAB — GC/CHLAMYDIA PROBE AMP (~~LOC~~) NOT AT ARMC
Chlamydia: NEGATIVE
Neisseria Gonorrhea: NEGATIVE

## 2014-08-17 LAB — RPR: RPR Ser Ql: NONREACTIVE

## 2014-08-17 LAB — HIV ANTIBODY (ROUTINE TESTING W REFLEX): HIV Screen 4th Generation wRfx: NONREACTIVE

## 2014-08-18 ENCOUNTER — Telehealth (HOSPITAL_BASED_OUTPATIENT_CLINIC_OR_DEPARTMENT_OTHER): Payer: Self-pay | Admitting: Emergency Medicine

## 2014-08-18 NOTE — Telephone Encounter (Signed)
Pt called requesting change in antibiotic due to cost. Prescribing PA J. Hedges notified. Macrobid RX changed to Cipro 250 mg PO BID x five days. Cipro RX called to [place]. Pt will take prescribed Flagyl.

## 2014-08-18 NOTE — ED Provider Notes (Signed)
45 YOF was seen in the ED on 08/16/14 with a diagnosis of UTI and BV. Pt was discharged with metronidazole and Macrobid. Pt attempted to fill her prescription today but reports the Macrobid was $35. I spoke with the flo manager today who spoke directly with the patient requesting cheaper medication. Although Macrobid is the best choice based on susceptibility, Cipro is the next best option based on pricing and susceptibility. Cipro 250 BID x 5 days.   Okey Regal, PA-C 08/18/14 [place], MD 08/20/14 [phone]

## 2015-02-27 ENCOUNTER — Ambulatory Visit: Payer: No Typology Code available for payment source | Admitting: Obstetrics & Gynecology

## 2015-03-20 ENCOUNTER — Ambulatory Visit: Payer: No Typology Code available for payment source | Admitting: Student

## 2015-04-10 ENCOUNTER — Ambulatory Visit: Payer: Self-pay | Admitting: Obstetrics & Gynecology

## 2015-05-17 ENCOUNTER — Ambulatory Visit (INDEPENDENT_AMBULATORY_CARE_PROVIDER_SITE_OTHER): Payer: Self-pay | Admitting: Pulmonary Disease

## 2015-05-17 ENCOUNTER — Encounter: Payer: Self-pay | Admitting: Pulmonary Disease

## 2015-05-17 VITALS — BP 107/78 | HR 68 | Temp 98.0°F | Ht 66.0 in | Wt 114.8 lb

## 2015-05-17 DIAGNOSIS — F17200 Nicotine dependence, unspecified, uncomplicated: Secondary | ICD-10-CM

## 2015-05-17 DIAGNOSIS — Z0000 Encounter for general adult medical examination without abnormal findings: Secondary | ICD-10-CM | POA: Insufficient documentation

## 2015-05-17 DIAGNOSIS — Z8249 Family history of ischemic heart disease and other diseases of the circulatory system: Secondary | ICD-10-CM

## 2015-05-17 DIAGNOSIS — Z8742 Personal history of other diseases of the female genital tract: Secondary | ICD-10-CM | POA: Insufficient documentation

## 2015-05-17 DIAGNOSIS — R6889 Other general symptoms and signs: Secondary | ICD-10-CM

## 2015-05-17 DIAGNOSIS — K649 Unspecified hemorrhoids: Secondary | ICD-10-CM

## 2015-05-17 DIAGNOSIS — Z8619 Personal history of other infectious and parasitic diseases: Secondary | ICD-10-CM | POA: Insufficient documentation

## 2015-05-17 DIAGNOSIS — R634 Abnormal weight loss: Secondary | ICD-10-CM

## 2015-05-17 DIAGNOSIS — F17210 Nicotine dependence, cigarettes, uncomplicated: Secondary | ICD-10-CM

## 2015-05-17 LAB — POCT URINE PREGNANCY: Preg Test, Ur: NEGATIVE

## 2015-05-17 NOTE — Assessment & Plan Note (Signed)
Assessment: 30 pack year history. She is down to 1/2 ppd. She is trying to quit on her own  Plan:  -Cessation counseling provided. -She declined pharmacologic aids in quitting smoking.

## 2015-05-17 NOTE — Assessment & Plan Note (Addendum)
Assessment: Weight loss, cold intolerance. Possible fatigue. She has not had her menstrual period since end of November.   Plan: -Check TSH -UPreg negative  Addendum: TSH within normal limits. Continue to monitor.

## 2015-05-17 NOTE — Progress Notes (Signed)
Subjective:    Patient ID: Lori Perkins, female    DOB: 02-25-1971, 45 y.o.   MRN: [id number]  HPI Lori Perkins is a 45 year old woman with history of hemorrhoids presenting for evaluation of blood per rectum.  She reports for the last month she has seen blood when she wipes following a bowel movement. She has had no episodes this past week. Denies any pain bowel movements. Denies nausea, vomiting, or abdominal pain. She is uncertain if she strains with some of her bowel movements. She has had problems with her hemorrhoids in the past. She reports that she does not drink much water.  Her diet consists of a lot of potatoes and starchy foods. She drinks a lot of soft drinks and tea, low water intake. Most of her meals are home cooked. She denies regular exercise. She reports she feels like she sleeps more than she should. She may have had 20lb weight loss since May. She has some cold intolerance.  Review of Systems Constitutional: no fevers/chills Eyes: no vision changes Ears, nose, mouth, throat, and face: +cough with little mucous Respiratory: no shortness of breath Cardiovascular: +chest pain with associated burping, occasionally burning, sharp, does not radiate, 1-2 times in last two weeks, occasional reflux Gastrointestinal: no nausea/vomiting, no abdominal pain, no constipation, no diarrhea Genitourinary: no dysuria, no hematuria Integument: no rash Hematologic/lymphatic: no bleeding/bruising, no edema Neurological: no weakness  Past Medical History  Diagnosis Date  . History of sexually transmitted disease 01/16/2014    BV, trichomonas  . History of abnormal cervical Pap smear 2013    2013 Pap smear LSIL+, HPV not detected, 2014 Colposcopy with benign tissue   . GERD (gastroesophageal reflux disease)    Past Surgical History  Procedure Laterality Date  . Multiple tooth extractions     Family History  Problem Relation Age of Onset  . Diabetes Mother   .  Hypertension Father    Social History   Social History  . Marital Status: Single    Spouse Name: N/A  . Number of Children: N/A  . Years of Education: N/A   Social History Main Topics  . Smoking status: Current Every Day Smoker -- 0.50 packs/day for 20 years    Types: Cigarettes  . Smokeless tobacco: Never Used  . Alcohol Use: Yes     Comment: occasionally  . Drug Use: Yes    Special: Marijuana  . Sexual Activity: Not Currently   Other Topics Concern  . None   Social History Narrative   Allergies  Allergen Reactions  . Penicillins     Pt states that she is not allergic to medication that she is aware of    Current Outpatient Prescriptions on File Prior to Visit  Medication Sig Dispense Refill  . acetaminophen (TYLENOL) 500 MG tablet Take 500 mg by mouth every 6 (six) hours as needed for mild pain.    . clindamycin (CLEOCIN) 300 MG capsule Take 1 capsule (300 mg total) by mouth 3 (three) times daily. (Patient not taking: Reported on 08/16/2014) 30 capsule 0  . hydrocortisone (ANUSOL-HC) 2.5 % rectal cream Apply rectally 2 times daily (Patient not taking: Reported on 08/16/2014) 30 g 0  . metroNIDAZOLE (FLAGYL) 500 MG tablet Take 1 tablet (500 mg total) by mouth 2 (two) times daily. 14 tablet 0  . nitrofurantoin, macrocrystal-monohydrate, (MACROBID) 100 MG capsule Take 1 capsule (100 mg total) by mouth 2 (two) times daily. 10 capsule 0  . traMADol (ULTRAM) 50  MG tablet Take 1 tablet (50 mg total) by mouth every 6 (six) hours as needed. (Patient not taking: Reported on 08/16/2014) 30 tablet 0   No current facility-administered medications on file prior to visit.    Today's Vitals   05/17/15 1033  BP: 107/78  Pulse: 68  Temp: 98 F (36.7 C)  TempSrc: Oral  Height: 5\' 6"  (1.676 m)  Weight: 114 lb 12.8 oz (52.073 kg)  SpO2: 100%   Objective:   Physical Exam  Constitutional: She is oriented to person, place, and time. She appears well-developed and well-nourished. No  distress.  HENT:  Head: Normocephalic and atraumatic.  Mouth/Throat: Oropharynx is clear and moist.  Eyes: Conjunctivae and EOM are normal. Pupils are equal, round, and reactive to light.  Neck: Neck supple.  Cardiovascular: Normal rate and regular rhythm.   No murmur heard. Pulmonary/Chest: Effort normal and breath sounds normal. She has no wheezes. She has no rales.  Abdominal: Soft. She exhibits no distension. There is no tenderness.  Musculoskeletal: Normal range of motion. She exhibits no edema.  Neurological: She is alert and oriented to person, place, and time. Coordination normal.  Skin: Skin is warm and dry. No rash noted. She is not diaphoretic.  Psychiatric: She has a normal mood and affect.   Assessment & Plan:  Please refer to problem based charting.

## 2015-05-17 NOTE — Assessment & Plan Note (Addendum)
Assessment: She has some risk factors for CVD. Father had stroke. +Tobacco use  Plan: -Obtain BMP -Obtain Lipid panel  ADDENDUM: Lipid panel total cholesterol 149 and HDL 65. 0.4% 10 year ASCVD risk. BMP unremarkable.

## 2015-05-17 NOTE — Assessment & Plan Note (Signed)
Assessment: blood noted upon wiping after BM. Probable secondary to hemorrhoids.   Plan: -Discussed with her increasing water intake and fiber -Continue to monitor

## 2015-05-17 NOTE — Patient Instructions (Addendum)
Health Maintenance, Female Adopting a healthy lifestyle and getting preventive care can go a long way to promote health and wellness. Talk with your health care provider about what schedule of regular examinations is right for you. This is a good chance for you to check in with your provider about disease prevention and staying healthy.  In between checkups, there are plenty of things you can do on your own. Experts have done a lot of research about which lifestyle changes and preventive measures are most likely to keep you healthy. Ask your health care provider for more information. WEIGHT AND DIET  Eat a healthy diet  Be sure to include plenty of vegetables, fruits, low-fat dairy products, and lean protein.  Do not eat a lot of foods high in solid fats, added sugars, or salt.  Get regular exercise. This is one of the most important things you can do for your health.  Most adults should exercise for at least 150 minutes each week (30 minutes a day, 5 days a week). The exercise should increase your heart rate and make you sweat (moderate-intensity exercise).  Most adults should also do strengthening exercises at least twice a week. This is in addition to the moderate-intensity exercise.  Maintain a healthy weight  Body mass index (BMI) is a measurement that can be used to identify possible weight problems. It estimates body fat based on height and weight. Your health care provider can help determine your BMI and help you achieve or maintain a healthy weight.  For females 64 years of age and older:   A BMI below 18.5 is considered underweight.  A BMI of 18.5 to 24.9 is normal.  A BMI of 25 to 29.9 is considered overweight.  A BMI of 30 and above is considered obese.  Watch levels of cholesterol and blood lipids  You should start having your blood tested for lipids and cholesterol at 44 years of age, then have this test every 5 years.  You may need to have your cholesterol levels  checked more often if:  Your lipid or cholesterol levels are high.  You are older than 44 years of age.  You are at high risk for heart disease.  CANCER SCREENING   Lung Cancer  Lung cancer screening is recommended for adults 47-58 years old who are at high risk for lung cancer because of a history of smoking.  A yearly low-dose CT scan of the lungs is recommended for people who:  Currently smoke.  Have quit within the past 15 years.  Have at least a 30-pack-year history of smoking. A pack year is smoking an average of one pack of cigarettes a day for 1 year.  Yearly screening should continue until it has been 15 years since you quit.  Yearly screening should stop if you develop a health problem that would prevent you from having lung cancer treatment.  Breast Cancer  Practice breast self-awareness. This means understanding how your breasts normally appear and feel.  If you are in your 20s or 30s, you should have a clinical breast exam (CBE) by a health care provider every 1-3 years as part of a regular health exam.  Consider having a breast X-ray (mammogram) every year.  If you have a family history of breast cancer, talk to your health care provider about genetic screening.  If you are at high risk for breast cancer, talk to your health care provider about having an MRI and a mammogram every year.  Breast  cancer gene (BRCA) assessment is recommended for women who have family members with BRCA-related cancers. BRCA-related cancers include:  Breast.  Ovarian.  Tubal.  Peritoneal cancers.  Cervical Cancer Your health care provider may recommend that you be screened regularly for cancer of the pelvic organs (ovaries, uterus, and vagina). This screening involves a pelvic examination, including checking for microscopic changes to the surface of your cervix (Pap test). You may be encouraged to have this screening done every 3 years, beginning at age 18.  For women ages  10-65, health care providers may recommend pelvic exams and Pap testing every 3 years, or they may recommend the Pap and pelvic exam, combined with testing for human papilloma virus (HPV), every 5 years. Some types of HPV increase your risk of cervical cancer. Testing for HPV may also be done on women of any age with unclear Pap test results.  Other health care providers may not recommend any screening for nonpregnant women who are considered low risk for pelvic cancer and who do not have symptoms. Ask your health care provider if a screening pelvic exam is right for you.  If you have had past treatment for cervical cancer or a condition that could lead to cancer, you need Pap tests and screening for cancer for at least 20 years after your treatment. If Pap tests have been discontinued, your risk factors (such as having a new sexual partner) need to be reassessed to determine if screening should resume. Some women have medical problems that increase the chance of getting cervical cancer. In these cases, your health care provider may recommend more frequent screening and Pap tests. Colorectal Cancer  This type of cancer can be detected and often prevented.  Routine colorectal cancer screening usually begins at 44 years of age and continues through 44 years of age.  Your health care provider may recommend screening at an earlier age if you have risk factors for colon cancer.  Your health care provider may also recommend using home test kits to check for hidden blood in the stool.  A small camera at the end of a tube can be used to examine your colon directly (sigmoidoscopy or colonoscopy). This is done to check for the earliest forms of colorectal cancer.  Routine screening usually begins at age 23.  Direct examination of the colon should be repeated every 5-10 years through 43 years of age. However, you may need to be screened more often if early forms of precancerous polyps or small growths are  found. Skin Cancer  Check your skin from head to toe regularly.  Tell your health care provider about any new moles or changes in moles, especially if there is a change in a mole's shape or color.  Also tell your health care provider if you have a mole that is larger than the size of a pencil eraser.  Always use sunscreen. Apply sunscreen liberally and repeatedly throughout the day.  Protect yourself by wearing long sleeves, pants, a wide-brimmed hat, and sunglasses whenever you are outside. HEART DISEASE, DIABETES, AND HIGH BLOOD PRESSURE   High blood pressure causes heart disease and increases the risk of stroke. High blood pressure is more likely to develop in:  People who have blood pressure in the high end of the normal range (130-139/85-89 mm Hg).  People who are overweight or obese.  People who are African American.  If you are 37-93 years of age, have your blood pressure checked every 3-5 years. If you are 40  years of age or older, have your blood pressure checked every year. You should have your blood pressure measured twice--once when you are at a hospital or clinic, and once when you are not at a hospital or clinic. Record the average of the two measurements. To check your blood pressure when you are not at a hospital or clinic, you can use:  An automated blood pressure machine at a pharmacy.  A home blood pressure monitor.  If you are between 23 years and 28 years old, ask your health care provider if you should take aspirin to prevent strokes.  Have regular diabetes screenings. This involves taking a blood sample to check your fasting blood sugar level.  If you are at a normal weight and have a low risk for diabetes, have this test once every three years after 44 years of age.  If you are overweight and have a high risk for diabetes, consider being tested at a younger age or more often. PREVENTING INFECTION  Hepatitis B  If you have a higher risk for hepatitis B,  you should be screened for this virus. You are considered at high risk for hepatitis B if:  You were born in a country where hepatitis B is common. Ask your health care provider which countries are considered high risk.  Your parents were born in a high-risk country, and you have not been immunized against hepatitis B (hepatitis B vaccine).  You have HIV or AIDS.  You use needles to inject street drugs.  You live with someone who has hepatitis B.  You have had sex with someone who has hepatitis B.  You get hemodialysis treatment.  You take certain medicines for conditions, including cancer, organ transplantation, and autoimmune conditions. Hepatitis C  Blood testing is recommended for:  Everyone born from [birth year] through 1964.  Anyone with known risk factors for hepatitis C. Sexually transmitted infections (STIs)  You should be screened for sexually transmitted infections (STIs) including gonorrhea and chlamydia if:  You are sexually active and are younger than 44 years of age.  You are older than 44 years of age and your health care provider tells you that you are at risk for this type of infection.  Your sexual activity has changed since you were last screened and you are at an increased risk for chlamydia or gonorrhea. Ask your health care provider if you are at risk.  If you do not have HIV, but are at risk, it may be recommended that you take a prescription medicine daily to prevent HIV infection. This is called pre-exposure prophylaxis (PrEP). You are considered at risk if:  You are sexually active and do not regularly use condoms or know the HIV status of your partner(s).  You take drugs by injection.  You are sexually active with a partner who has HIV. Talk with your health care provider about whether you are at high risk of being infected with HIV. If you choose to begin PrEP, you should first be tested for HIV. You should then be tested every 3 months for as long as  you are taking PrEP.  PREGNANCY   If you are premenopausal and you may become pregnant, ask your health care provider about preconception counseling.  If you may become pregnant, take 400 to 800 micrograms (mcg) of folic acid every day.  If you want to prevent pregnancy, talk to your health care provider about birth control (contraception). OSTEOPOROSIS AND MENOPAUSE   Osteoporosis is a disease in  which the bones lose minerals and strength with aging. This can result in serious bone fractures. Your risk for osteoporosis can be identified using a bone density scan.  If you are 78 years of age or older, or if you are at risk for osteoporosis and fractures, ask your health care provider if you should be screened.  Ask your health care provider whether you should take a calcium or vitamin D supplement to lower your risk for osteoporosis.  Menopause may have certain physical symptoms and risks.  Hormone replacement therapy may reduce some of these symptoms and risks. Talk to your health care provider about whether hormone replacement therapy is right for you.  HOME CARE INSTRUCTIONS   Schedule regular health, dental, and eye exams.  Stay current with your immunizations.   Do not use any tobacco products including cigarettes, chewing tobacco, or electronic cigarettes.  If you are pregnant, do not drink alcohol.  If you are breastfeeding, limit how much and how often you drink alcohol.  Limit alcohol intake to no more than 1 drink per day for nonpregnant women. One drink equals 12 ounces of beer, 5 ounces of wine, or 1 ounces of hard liquor.  Do not use street drugs.  Do not share needles.  Ask your health care provider for help if you need support or information about quitting drugs.  Tell your health care provider if you often feel depressed.  Tell your health care provider if you have ever been abused or do not feel safe at home.   This information is not intended to replace  advice given to you by your health care provider. Make sure you discuss any questions you have with your health care provider.   Document Released: 11/10/2010 Document Revised: 05/18/2014 Document Reviewed: 03/29/2013 Elsevier Interactive Patient Education [employer].

## 2015-05-17 NOTE — Assessment & Plan Note (Signed)
Assessment: LSIL in 2013 with negative colposcopy. She has a follow up appointment wit her gynecologist this month.  Plan: -Encouraged her to follow up with gynecology for consideration of routine pap smear

## 2015-05-18 LAB — LIPID PANEL
Chol/HDL Ratio: 2.3 ratio units (ref 0.0–4.4)
Cholesterol, Total: 149 mg/dL (ref 100–199)
HDL: 65 mg/dL (ref >39)
LDL Calculated: 77 mg/dL (ref 0–99)
Triglycerides: 37 mg/dL (ref 0–149)
VLDL Cholesterol Cal: 7 mg/dL (ref 5–40)

## 2015-05-18 LAB — BMP8+ANION GAP
Anion Gap: 19 mmol/L — ABNORMAL HIGH (ref 10.0–18.0)
BUN/Creatinine Ratio: 15 (ref 9–23)
BUN: 12 mg/dL (ref 6–24)
CO2: 22 mmol/L (ref 18–29)
Calcium: 9.8 mg/dL (ref 8.7–10.2)
Chloride: 99 mmol/L (ref 96–106)
Creatinine, Ser: 0.82 mg/dL (ref 0.57–1.00)
GFR calc Af Amer: 101 mL/min/{1.73_m2} (ref >59)
GFR calc non Af Amer: 88 mL/min/{1.73_m2} (ref >59)
Glucose: 90 mg/dL (ref 65–99)
Potassium: 4.5 mmol/L (ref 3.5–5.2)
Sodium: 140 mmol/L (ref 134–144)

## 2015-05-18 LAB — TSH: TSH: 1.21 u[IU]/mL (ref 0.450–4.500)

## 2015-05-20 NOTE — Progress Notes (Signed)
Internal Medicine Clinic Attending  Case discussed with Dr. Krall at the time of the visit.  We reviewed the resident's history and exam and pertinent patient test results.  I agree with the assessment, diagnosis, and plan of care documented in the resident's note.  

## 2015-05-22 ENCOUNTER — Ambulatory Visit: Payer: Self-pay | Admitting: Obstetrics & Gynecology

## 2015-06-17 ENCOUNTER — Ambulatory Visit: Payer: Self-pay | Admitting: Obstetrics & Gynecology

## 2015-07-17 ENCOUNTER — Ambulatory Visit: Payer: Self-pay | Admitting: Obstetrics & Gynecology

## 2015-09-26 ENCOUNTER — Emergency Department (HOSPITAL_COMMUNITY): Payer: Medicaid Other

## 2015-09-26 ENCOUNTER — Encounter (HOSPITAL_COMMUNITY): Payer: Self-pay | Admitting: Emergency Medicine

## 2015-09-26 ENCOUNTER — Emergency Department (HOSPITAL_COMMUNITY)
Admission: EM | Admit: 2015-09-26 | Discharge: 2015-09-26 | Disposition: A | Discharge status: Home / Self Care | Payer: Medicaid Other | Attending: Emergency Medicine | Admitting: Emergency Medicine

## 2015-09-26 DIAGNOSIS — F17210 Nicotine dependence, cigarettes, uncomplicated: Secondary | ICD-10-CM | POA: Insufficient documentation

## 2015-09-26 DIAGNOSIS — Z880 Allergy status to penicillin: Secondary | ICD-10-CM | POA: Insufficient documentation

## 2015-09-26 DIAGNOSIS — Z8719 Personal history of other diseases of the digestive system: Secondary | ICD-10-CM | POA: Insufficient documentation

## 2015-09-26 DIAGNOSIS — Z8619 Personal history of other infectious and parasitic diseases: Secondary | ICD-10-CM | POA: Insufficient documentation

## 2015-09-26 DIAGNOSIS — R079 Chest pain, unspecified: Secondary | ICD-10-CM | POA: Insufficient documentation

## 2015-09-26 LAB — CBC
HCT: 39.6 % (ref 36.0–46.0)
Hemoglobin: 12.9 g/dL (ref 12.0–15.0)
MCH: 31.5 pg (ref 26.0–34.0)
MCHC: 32.6 g/dL (ref 30.0–36.0)
MCV: 96.8 fL (ref 78.0–100.0)
Platelets: 270 10*3/uL (ref 150–400)
RBC: 4.09 MIL/uL (ref 3.87–5.11)
RDW: 13 % (ref 11.5–15.5)
WBC: 9 10*3/uL (ref 4.0–10.5)

## 2015-09-26 LAB — BASIC METABOLIC PANEL
Anion gap: 7 (ref 5–15)
BUN: 10 mg/dL (ref 6–20)
CO2: 26 mmol/L (ref 22–32)
Calcium: 9.1 mg/dL (ref 8.9–10.3)
Chloride: 107 mmol/L (ref 101–111)
Creatinine, Ser: 0.94 mg/dL (ref 0.44–1.00)
GFR calc Af Amer: >60 mL/min (ref >60)
GFR calc non Af Amer: >60 mL/min (ref >60)
Glucose, Bld: 76 mg/dL (ref 65–99)
Potassium: 3.6 mmol/L (ref 3.5–5.1)
Sodium: 140 mmol/L (ref 135–145)

## 2015-09-26 LAB — I-STAT TROPONIN, ED: Troponin i, poc: 0.01 ng/mL (ref 0.00–0.08)

## 2015-09-26 MED ORDER — IBUPROFEN 200 MG PO TABS
400.0000 mg | ORAL_TABLET | Freq: Once | ORAL | Status: AC
  Administered 2015-09-26: 400 mg via ORAL
  Filled 2015-09-26: qty 2

## 2015-09-26 NOTE — ED Notes (Signed)
Pt reports left sided CP x 3 days. Pt alert x4. NAD at this time.

## 2015-09-26 NOTE — Discharge Instructions (Signed)

## 2015-09-26 NOTE — ED Provider Notes (Signed)
CSN: [id number]     Arrival date & time 09/26/15  1402 History   First MD Initiated Contact with Patient 09/26/15 1629     Chief Complaint  Patient presents with  . Chest Pain     (Consider location/radiation/quality/duration/timing/severity/associated sxs/prior Treatment) HPI   45 year old female chest pain. Gradual onset about 3 days ago. She cannot remember which specifically doing when she first noticed the pain. Her pain has been constant since then. It is worse with certain movements and taking deep breaths. She does not feel short of breath though. No cough. No fevers or chills. No unusual leg pain or swelling. Denies any dizziness or lightheadedness. No nausea. She denies any trauma or strain. Has tried taking Tylenol with some mild relief. No history similar type symptoms. No known history of CAD that she is aware of. Is a smoker.  Past Medical History  Diagnosis Date  . History of sexually transmitted disease 01/16/2014    BV, trichomonas  . History of abnormal cervical Pap smear 2013    2013 Pap smear LSIL+, HPV not detected, 2014 Colposcopy with benign tissue   . GERD (gastroesophageal reflux disease)    Past Surgical History  Procedure Laterality Date  . Multiple tooth extractions     Family History  Problem Relation Age of Onset  . Diabetes Mother   . Hypertension Father   . Stroke Father   . Cancer Maternal Aunt   . Cancer Maternal Uncle   . Diabetes Maternal Grandmother    Social History  Substance Use Topics  . Smoking status: Current Every Day Smoker -- 0.50 packs/day for 20 years    Types: Cigarettes  . Smokeless tobacco: Never Used  . Alcohol Use: Yes     Comment: occasionally   OB History    No data available     Review of Systems  All systems reviewed and negative, other than as noted in HPI.   Allergies  Penicillins  Home Medications   Prior to Admission medications   Medication Sig Start Date End Date Taking? Authorizing Provider    acetaminophen (TYLENOL) 500 MG tablet Take 500 mg by mouth every 6 (six) hours as needed for mild pain.   Yes Historical Provider, MD  diphenhydrAMINE (BENADRYL) 25 MG tablet Take 25 mg by mouth every 6 (six) hours as needed for itching.   Yes Historical Provider, MD   BP 149/93 mmHg  Pulse 92  Temp(Src) 98.8 F (37.1 C) (Oral)  Resp 18  SpO2 100%  LMP 09/02/2015 (Exact Date) Physical Exam  Constitutional: She appears well-developed and well-nourished. No distress.  HENT:  Head: Normocephalic and atraumatic.  Eyes: Conjunctivae are normal. Right eye exhibits no discharge. Left eye exhibits no discharge.  Neck: Neck supple.  Cardiovascular: Normal rate, regular rhythm and normal heart sounds.  Exam reveals no gallop and no friction rub.   No murmur heard. Pulmonary/Chest: Effort normal and breath sounds normal. No respiratory distress. She exhibits tenderness.    Tenderness to palpation in the pictured area. No overlying skin changes. No crepitus. Breath sounds are clear bilaterally with symmetric chest rise.  Abdominal: Soft. She exhibits no distension. There is no tenderness.  Musculoskeletal: She exhibits no edema or tenderness.  Lower extremities symmetric as compared to each other. No calf tenderness. Negative Homan's. No palpable cords.   Neurological: She is alert.  Skin: Skin is warm and dry.  Psychiatric: She has a normal mood and affect. Her behavior is normal. Thought content normal.  Nursing note and vitals reviewed.   ED Course  Procedures (including critical care time) Labs Review Labs Reviewed  BASIC METABOLIC PANEL  [place], ED    Imaging Review Dg Chest 2 View  09/26/2015  CLINICAL DATA:  Chest pain for 3 days EXAM: CHEST  2 VIEW COMPARISON:  None. FINDINGS: There is no appreciable edema or consolidation. The heart size and pulmonary vascularity are normal. No adenopathy. No pneumothorax. No bone lesions. IMPRESSION: No edema or  consolidation. Electronically Signed   By: Lowella Grip III M.D.   On: 09/26/2015 14:40   I have personally reviewed and evaluated these images and lab results as part of my medical decision-making.   EKG Interpretation   Date/Time:  Thursday Sep 26 2015 14:10:19 EDT Ventricular Rate:  105 PR Interval:  132 QRS Duration: 74 QT Interval:  342 QTC Calculation: 452 R Axis:   86 Text Interpretation:  Sinus tachycardia Biatrial enlargement Nonspecific  ST and T wave abnormality Abnormal ECG No old tracing to compare Confirmed  by Franklin  MD, [place] ([id number]) on 09/26/2015 4:35:49 PM      MDM   Final diagnoses:  Left sided chest pain    45 year old female with left-sided chest pain. Atypical for ACS. Doubt PE or dissection. Possibly musculoskeletal given reproducibility. Fairly constant symptoms for the past 2 days. Troponin is normal. Chest x-ray is with out acute abnormality. It has been determined that no acute conditions requiring further emergency intervention are present at this time. The patient has been advised of the diagnosis and plan. I reviewed any labs and imaging including any potential incidental findings. We have discussed signs and symptoms that warrant return to the ED and they are listed in the discharge instructions.      Virgel Manifold, MD 09/26/15 [phone]

## 2015-10-09 ENCOUNTER — Encounter (HOSPITAL_COMMUNITY): Payer: Self-pay | Admitting: Emergency Medicine

## 2015-10-09 ENCOUNTER — Emergency Department (HOSPITAL_COMMUNITY)
Admission: EM | Admit: 2015-10-09 | Discharge: 2015-10-09 | Disposition: A | Discharge status: Home / Self Care | Payer: Medicaid Other | Attending: Emergency Medicine | Admitting: Emergency Medicine

## 2015-10-09 DIAGNOSIS — R42 Dizziness and giddiness: Secondary | ICD-10-CM | POA: Insufficient documentation

## 2015-10-09 DIAGNOSIS — Z880 Allergy status to penicillin: Secondary | ICD-10-CM | POA: Insufficient documentation

## 2015-10-09 DIAGNOSIS — R110 Nausea: Secondary | ICD-10-CM | POA: Insufficient documentation

## 2015-10-09 DIAGNOSIS — R21 Rash and other nonspecific skin eruption: Secondary | ICD-10-CM | POA: Insufficient documentation

## 2015-10-09 DIAGNOSIS — R109 Unspecified abdominal pain: Secondary | ICD-10-CM | POA: Insufficient documentation

## 2015-10-09 DIAGNOSIS — Z8719 Personal history of other diseases of the digestive system: Secondary | ICD-10-CM | POA: Insufficient documentation

## 2015-10-09 DIAGNOSIS — Z8619 Personal history of other infectious and parasitic diseases: Secondary | ICD-10-CM | POA: Insufficient documentation

## 2015-10-09 DIAGNOSIS — F17210 Nicotine dependence, cigarettes, uncomplicated: Secondary | ICD-10-CM | POA: Insufficient documentation

## 2015-10-09 MED ORDER — PREDNISONE 20 MG PO TABS: 40.0000 mg | ORAL_TABLET | Freq: Every day | ORAL | Status: DC

## 2015-10-09 NOTE — Discharge Instructions (Signed)
Please take your prednisone as prescribed. Also take Benadryl, 25 mg each night as we discussed. You may also try taking another allergy medicine that does not make you drowsy such as Zyrtec, Claritin or Allegra. Follow-up with your doctor. It is important for you to also stop smoking. Return to ED for new or worsening symptoms.  Allergies An allergy is an abnormal reaction to a substance by the body's defense system (immune system). Allergies can develop at any age. WHAT CAUSES ALLERGIES? An allergic reaction happens when the immune system mistakenly reacts to a normally harmless substance, called an allergen, as if it were harmful. The immune system releases antibodies to fight the substance. Antibodies eventually release a chemical called histamine into the bloodstream. The release of histamine is meant to protect the body from infection, but it also causes discomfort. An allergic reaction can be triggered by:  Eating an allergen.  Inhaling an allergen.  Touching an allergen. WHAT TYPES OF ALLERGIES ARE THERE? There are many types of allergies. Common types include:  Seasonal allergies. People with this type of allergy are usually allergic to substances that are only present during certain seasons, such as molds and pollens.  Food allergies.  Drug allergies.  Insect allergies.  Animal dander allergies. WHAT ARE SYMPTOMS OF ALLERGIES? Possible allergy symptoms include:  Swelling of the lips, face, tongue, mouth, or throat.  Sneezing, coughing, or wheezing.  Nasal congestion.  Tingling in the mouth.  Rash.  Itching.  Itchy, red, swollen areas of skin (hives).  Watery eyes.  Vomiting.  Diarrhea.  Dizziness.  Lightheadedness.  Fainting.  Trouble breathing or swallowing.  Chest tightness.  Rapid heartbeat. HOW ARE ALLERGIES DIAGNOSED? Allergies are diagnosed with a medical and family history and one or more of the following:  Skin tests.  Blood  tests.  A food diary. A food diary is a record of all the foods and drinks you have in a day and of all the symptoms you experience.  The results of an elimination diet. An elimination diet involves eliminating foods from your diet and then adding them back in one by one to find out if a certain food causes an allergic reaction. HOW ARE ALLERGIES TREATED? There is no cure for allergies, but allergic reactions can be treated with medicine. Severe reactions usually need to be treated at a hospital. HOW CAN REACTIONS BE PREVENTED? The best way to prevent an allergic reaction is by avoiding the substance you are allergic to. Allergy shots and medicines can also help prevent reactions in some cases. People with severe allergic reactions may be able to prevent a life-threatening reaction called anaphylaxis with a medicine given right after exposure to the allergen.   This information is not intended to replace advice given to you by your health care provider. Make sure you discuss any questions you have with your health care provider.   Document Released: 07/21/2002 Document Revised: 05/18/2014 Document Reviewed: 02/06/2014 Elsevier Interactive Patient Education [employer].

## 2015-10-09 NOTE — ED Notes (Signed)
C/o rash to arms and neck x 3-4 weeks.  States she uses hydrocortisone cream and it seems to go away but then returns.

## 2015-10-09 NOTE — ED Provider Notes (Signed)
CSN: [id number]     Arrival date & time 10/09/15  1936 History  By signing my name below, I, [facility], attest that this documentation has been prepared under the direction and in the presence of [employer], PA-C. Electronically Signed: Virgel Bouquet, ED Scribe. 10/09/2015. 8:36 PM.   Chief Complaint  Patient presents with  . Rash    The history is provided by the patient. No language interpreter was used.  HPI Comments: Lori Perkins is a 45 y.o. female who presents to the Emergency Department complaining of intermittent, mildly pruritic rash to her BUE and neck onset 4 weeks ago.  She reports SOB that lasted for one day last week, trouble swallowing intermittently, lightheadedness for 3 days, nausea and cramping abdominal pain this morning that she associates with her menstrual cycle. She has taken Benadryl and applied hydrocortisone cream with temporary relief but has not taken this Benadryl in the past 5 days. She is a current cigarette smoker. Denies changes in soaps, lotions, detergents, perfumes, and antibiotic use. Denies trouble swallowing, SOB currently, emesis or any other symptoms currently.   Past Medical History  Diagnosis Date  . History of sexually transmitted disease 01/16/2014    BV, trichomonas  . History of abnormal cervical Pap smear 2013    2013 Pap smear LSIL+, HPV not detected, 2014 Colposcopy with benign tissue   . GERD (gastroesophageal reflux disease)    Past Surgical History  Procedure Laterality Date  . Multiple tooth extractions     Family History  Problem Relation Age of Onset  . Diabetes Mother   . Hypertension Father   . Stroke Father   . Cancer Maternal Aunt   . Cancer Maternal Uncle   . Diabetes Maternal Grandmother    Social History  Substance Use Topics  . Smoking status: Current Every Day Smoker -- 0.50 packs/day for 20 years    Types: Cigarettes  . Smokeless tobacco: Never Used  . Alcohol Use: Yes     Comment:  occasionally   OB History    No data available     Review of Systems  HENT: Negative for trouble swallowing (currently).   Respiratory: Negative for shortness of breath (currently).   Gastrointestinal: Positive for nausea and abdominal pain. Negative for vomiting.  Skin: Positive for rash.  Neurological: Positive for light-headedness.  All other systems reviewed and are negative.     Allergies  Penicillins  Home Medications   Prior to Admission medications   Medication Sig Start Date End Date Taking? Authorizing Provider  acetaminophen (TYLENOL) 500 MG tablet Take 500 mg by mouth every 6 (six) hours as needed for mild pain.    Historical Provider, MD  diphenhydrAMINE (BENADRYL) 25 MG tablet Take 25 mg by mouth every 6 (six) hours as needed for itching.    Historical Provider, MD  predniSONE (DELTASONE) 20 MG tablet Take 2 tablets (40 mg total) by mouth daily. 10/09/15   Chiara Coltrin, PA-C   BP 153/107 mmHg  Pulse 67  Temp(Src) 98.4 F (36.9 C) (Oral)  Resp 16  Ht 5\' 7"  (1.702 m)  Wt 111 lb 4.8 oz (50.485 kg)  BMI 17.43 kg/m2  SpO2 100%  LMP 10/02/2015 (Exact Date) Physical Exam  Constitutional: She is oriented to person, place, and time. She appears well-developed and well-nourished. No distress.  HENT:  Head: Normocephalic and atraumatic.  Mouth/Throat: Oropharynx is clear and moist.  No mucous membrane involvement  Eyes: Conjunctivae are normal.  Neck: Normal range of  motion.  Cardiovascular: Normal rate.   Pulmonary/Chest: Effort normal. No respiratory distress.  Musculoskeletal: Normal range of motion.  Neurological: She is alert and oriented to person, place, and time.  Skin: Skin is warm and dry.  Mild erythematous, maculopapular rash to bilateral upper arms, mildly to posterior neck. No sloughing or other abnormal findings.  Psychiatric: She has a normal mood and affect. Her behavior is normal.  Nursing note and vitals reviewed.   ED Course   Procedures   DIAGNOSTIC STUDIES: Oxygen Saturation is 100% on RA, normal by my interpretation.    COORDINATION OF CARE: 8:27 PM Will prescribe prednisone. Advised pt to take Benadryl nightly and begin a regimen of a different allergy medication. Discussed treatment plan with pt at bedside and pt agreed to plan.   MDM   Final diagnoses:  Rash    Patient with nonspecific Rash eruption. No signs of infection. Discharge with symptomatic treatment. Follow up with PCP in 2-3 days.  I personally performed the services described in this documentation, which was scribed in my presence. The recorded information has been reviewed and is accurate.    Comer Locket, PA-C 10/09/15 2049  Isla Pence, MD 10/09/15 [phone]

## 2015-10-10 ENCOUNTER — Telehealth: Payer: Self-pay | Admitting: *Deleted

## 2015-10-10 NOTE — ED Notes (Signed)
Contacted by patient who stated she now prefers cream instead of tablets for her rash.  Chart reviewed by Domenic Moras, PA-C, Triamcinolone Cream 0.1%, apply topically BID PRN, disp: 30grams. Called to [facility] [phone].

## 2016-03-04 ENCOUNTER — Telehealth: Payer: Self-pay | Admitting: General Practice

## 2016-03-04 NOTE — Telephone Encounter (Signed)
APT. REMINDER CALL, NO ANSWER, NO VOICEMAIL °

## 2016-03-05 ENCOUNTER — Ambulatory Visit: Payer: Medicaid Other

## 2016-03-11 ENCOUNTER — Ambulatory Visit: Payer: Medicaid Other

## 2016-03-16 ENCOUNTER — Telehealth: Payer: Self-pay | Admitting: General Practice

## 2016-03-16 NOTE — Telephone Encounter (Signed)
APT. REMINDER CALL, LMTCB °

## 2016-03-17 ENCOUNTER — Ambulatory Visit: Payer: Medicaid Other

## 2016-04-09 ENCOUNTER — Ambulatory Visit: Payer: Medicaid Other

## 2016-04-30 ENCOUNTER — Ambulatory Visit: Payer: Medicaid Other

## 2017-01-21 ENCOUNTER — Emergency Department (HOSPITAL_COMMUNITY)
Admission: EM | Admit: 2017-01-21 | Discharge: 2017-01-21 | Disposition: A | Discharge status: Home / Self Care | Payer: Self-pay | Attending: Emergency Medicine | Admitting: Emergency Medicine

## 2017-01-21 ENCOUNTER — Encounter (HOSPITAL_COMMUNITY): Payer: Self-pay | Admitting: Emergency Medicine

## 2017-01-21 DIAGNOSIS — Y939 Activity, unspecified: Secondary | ICD-10-CM | POA: Insufficient documentation

## 2017-01-21 DIAGNOSIS — Y929 Unspecified place or not applicable: Secondary | ICD-10-CM | POA: Insufficient documentation

## 2017-01-21 DIAGNOSIS — F17210 Nicotine dependence, cigarettes, uncomplicated: Secondary | ICD-10-CM | POA: Insufficient documentation

## 2017-01-21 DIAGNOSIS — W25XXXA Contact with sharp glass, initial encounter: Secondary | ICD-10-CM | POA: Insufficient documentation

## 2017-01-21 DIAGNOSIS — Y999 Unspecified external cause status: Secondary | ICD-10-CM | POA: Insufficient documentation

## 2017-01-21 DIAGNOSIS — Z23 Encounter for immunization: Secondary | ICD-10-CM | POA: Insufficient documentation

## 2017-01-21 DIAGNOSIS — Z79899 Other long term (current) drug therapy: Secondary | ICD-10-CM | POA: Insufficient documentation

## 2017-01-21 DIAGNOSIS — S61411A Laceration without foreign body of right hand, initial encounter: Secondary | ICD-10-CM | POA: Insufficient documentation

## 2017-01-21 MED ORDER — LIDOCAINE HCL (PF) 1 % IJ SOLN: 5.0000 mL | Freq: Once | INTRAMUSCULAR | Status: DC

## 2017-01-21 MED ORDER — LIDOCAINE HCL (PF) 2 % IJ SOLN
INTRAMUSCULAR | Status: AC
  Administered 2017-01-21: 10 mL
  Filled 2017-01-21: qty 10

## 2017-01-21 MED ORDER — TETANUS-DIPHTH-ACELL PERTUSSIS 5-2.5-18.5 LF-MCG/0.5 IM SUSP
0.5000 mL | Freq: Once | INTRAMUSCULAR | Status: AC
  Administered 2017-01-21: 0.5 mL via INTRAMUSCULAR
  Filled 2017-01-21: qty 0.5

## 2017-01-21 NOTE — ED Triage Notes (Signed)
Patient reports she locked her purse in the house and hit glass on door causing it to break and cut her right thumb.  Patient reports this happened around 1am.

## 2017-01-21 NOTE — ED Provider Notes (Signed)
[facility] DEPT Provider Note   CSN: [id number] Arrival date & time: 01/21/17  0626     History   Chief Complaint Chief Complaint  Patient presents with  . Laceration    HPI Lori Perkins is a 46 y.o. female who presents to the ED with a laceration to the right hand. Patient reports she cut it with a piece of glass from a broken window.    The history is provided by the patient. No language interpreter was used.  Laceration   The incident occurred 6 to 12 hours ago. The laceration is located on the right hand. The laceration is 3 cm in size. The laceration mechanism was a broken glass. The pain is at a severity of 2/10. Her tetanus status is unknown.    Past Medical History:  Diagnosis Date  . GERD (gastroesophageal reflux disease)   . History of abnormal cervical Pap smear 2013   2013 Pap smear LSIL+, HPV not detected, 2014 Colposcopy with benign tissue   . History of sexually transmitted disease 01/16/2014   BV, trichomonas    Patient Active Problem List   Diagnosis Date Noted  . History of sexually transmitted disease 05/17/2015  . History of abnormal cervical Pap smear 05/17/2015  . Hemorrhoids 05/17/2015  . Cold intolerance 05/17/2015  . Preventative health care 05/17/2015  . Tobacco use disorder 05/17/2015    Past Surgical History:  Procedure Laterality Date  . MULTIPLE TOOTH EXTRACTIONS      OB History    No data available       Home Medications    Prior to Admission medications   Medication Sig Start Date End Date Taking? Authorizing Provider  acetaminophen (TYLENOL) 500 MG tablet Take 500 mg by mouth every 6 (six) hours as needed for mild pain.    [provider]  diphenhydrAMINE (BENADRYL) 25 MG tablet Take 25 mg by mouth every 6 (six) hours as needed for itching.    [provider]  predniSONE (DELTASONE) 20 MG tablet Take 2 tablets (40 mg total) by mouth daily. 10/09/15   Comer Locket, PA-C    Family History Family  History  Problem Relation Age of Onset  . Diabetes Mother   . Hypertension Father   . Stroke Father   . Cancer Maternal Aunt   . Cancer Maternal Uncle   . Diabetes Maternal Grandmother     Social History Social History  Substance Use Topics  . Smoking status: Current Every Day Smoker    Packs/day: 0.50    Years: 20.00    Types: Cigarettes  . Smokeless tobacco: Never Used  . Alcohol use Yes     Comment: occasionally     Allergies   Penicillins   Review of Systems Review of Systems  Skin: Positive for wound.  All other systems reviewed and are negative.    Physical Exam Updated Vital Signs BP (!) 151/95 (BP Location: Right Arm)   Pulse 90   Temp 98.2 F (36.8 C) (Oral)   Resp 16   LMP 12/28/2016   SpO2 100%   Physical Exam  Constitutional: She appears well-developed and well-nourished. No distress.  Eyes: EOM are normal.  Neck: Neck supple.  Cardiovascular: Normal rate.   Pulmonary/Chest: Effort normal.  Abdominal: Soft. There is no tenderness.  Musculoskeletal: Normal range of motion.       Right hand: She exhibits tenderness and laceration. She exhibits normal range of motion and normal capillary refill. Normal sensation noted. Normal strength noted.  She exhibits no thumb/finger opposition.       Hands: Flap laceration to the right hand palmar aspect.   Neurological: She is alert.  Skin: Skin is warm and dry.  Psychiatric: She has a normal mood and affect.  Nursing note and vitals reviewed.    ED Treatments / Results  Labs (all labs ordered are listed, but only abnormal results are displayed) Labs Reviewed - No data to display   Radiology No results found.  Procedures .Marland KitchenLaceration Repair Date/Time: 01/21/2017 12:20 PM Performed by: Ashley Murrain Authorized by: Ashley Murrain   Consent:    Consent obtained:  Verbal   Consent given by:  Patient   Risks discussed:  Pain, infection and poor cosmetic result   Alternatives discussed:  No  treatment Anesthesia (see MAR for exact dosages):    Anesthesia method:  Local infiltration   Local anesthetic:  Lidocaine 2% w/o epi Laceration details:    Location:  Hand   Hand location:  R palm   Length (cm):  3 Repair type:    Repair type:  Simple Pre-procedure details:    Preparation:  Patient was prepped and draped in usual sterile fashion and imaging obtained to evaluate for foreign bodies Exploration:    Hemostasis achieved with:  Direct pressure   Wound exploration: wound explored through full range of motion and entire depth of wound probed and visualized     Wound extent: no foreign bodies/material noted (none visualized or palpated)     Contaminated: no   Treatment:    Area cleansed with:  Betadine   Irrigation solution:  Sterile saline   Irrigation method:  Syringe Skin repair:    Repair method:  Sutures   Suture size:  5-0   Suture material:  Prolene   Number of sutures:  5 Approximation:    Approximation:  Close   Vermilion border: well-aligned   Post-procedure details:    Dressing:  Non-adherent dressing and sterile dressing Comments:     Tetanus updated.   (including critical care time)  Medications Ordered in ED Medications  lidocaine (PF) (XYLOCAINE) 1 % injection 5 mL (not administered)  Tdap (BOOSTRIX) injection 0.5 mL (not administered)  lidocaine (XYLOCAINE) 2 % injection (10 mLs  Given 01/21/17 1156)     Initial Impression / Assessment and Plan / ED Course  I have reviewed the triage vital signs and the nursing notes.  46 y.o. female with laceration to the right hand stable for d/c without focal neuro deficits. Instructions to f/u in one week for suture removal or sooner for any problems.   Final Clinical Impressions(s) / ED Diagnoses   Final diagnoses:  Laceration of right hand without foreign body, initial encounter    New Prescriptions New Prescriptions   No medications on file     Ashley Murrain, NP 01/21/17 [place], MD 01/21/17 [phone]

## 2017-04-12 ENCOUNTER — Other Ambulatory Visit: Payer: Self-pay

## 2017-04-12 ENCOUNTER — Emergency Department (HOSPITAL_COMMUNITY)
Admission: EM | Admit: 2017-04-12 | Discharge: 2017-04-12 | Disposition: A | Discharge status: Home / Self Care | Payer: Self-pay | Attending: Emergency Medicine | Admitting: Emergency Medicine

## 2017-04-12 ENCOUNTER — Encounter (HOSPITAL_COMMUNITY): Payer: Self-pay

## 2017-04-12 DIAGNOSIS — B353 Tinea pedis: Secondary | ICD-10-CM

## 2017-04-12 DIAGNOSIS — F17210 Nicotine dependence, cigarettes, uncomplicated: Secondary | ICD-10-CM | POA: Insufficient documentation

## 2017-04-12 MED ORDER — CLOTRIMAZOLE 1 % EX CREA: TOPICAL_CREAM | CUTANEOUS | 0 refills | Status: DC

## 2017-04-12 NOTE — ED Provider Notes (Signed)
[facility] DEPT Provider Note   CSN: [id number] Arrival date & time: 04/12/17  5009     History   Chief Complaint Chief Complaint  Patient presents with  . Rash    HPI Lori Perkins is a 46 y.o. female.  46 year old female presents with complaint of painful itchy rash to both feet.  She denies significant related prior medical history.  She reports that the rash has been present for at least the last 3-4 weeks.  She reports that she is using an antibiotic cream to the areas without improvement.  She reports dry cracking itchy skin to both feet -- especially to the soles of the foot and in between the toes.  She denies associated fever, pain, other rash, or other complaint.   The history is provided by the patient.  Rash   This is a chronic problem. The current episode started more than 1 week ago. The problem has not changed since onset.The problem is associated with nothing. There has been no fever. The rash is present on the right foot and left foot. The patient is experiencing no pain. The pain has been constant since onset. Associated symptoms include itching. Pertinent negatives include no blisters and no pain. She has tried nothing for the symptoms. The treatment provided no relief.    Past Medical History:  Diagnosis Date  . GERD (gastroesophageal reflux disease)   . History of abnormal cervical Pap smear 2013   2013 Pap smear LSIL+, HPV not detected, 2014 Colposcopy with benign tissue   . History of sexually transmitted disease 01/16/2014   BV, trichomonas    Patient Active Problem List   Diagnosis Date Noted  . History of sexually transmitted disease 05/17/2015  . History of abnormal cervical Pap smear 05/17/2015  . Hemorrhoids 05/17/2015  . Cold intolerance 05/17/2015  . Preventative health care 05/17/2015  . Tobacco use disorder 05/17/2015    Past Surgical History:  Procedure Laterality Date  . MULTIPLE TOOTH EXTRACTIONS       OB History    No data available       Home Medications    Prior to Admission medications   Medication Sig Start Date End Date Taking? Authorizing Provider  acetaminophen (TYLENOL) 500 MG tablet Take 1,000 mg by mouth every 6 (six) hours as needed for mild pain.    Yes [provider]  ibuprofen (ADVIL,MOTRIN) 200 MG tablet Take 200 mg by mouth every 6 (six) hours as needed.   Yes [provider]  clotrimazole (LOTRIMIN) 1 % cream Apply to affected area 2 times daily 04/12/17   Valarie Merino, MD  predniSONE (DELTASONE) 20 MG tablet Take 2 tablets (40 mg total) by mouth daily. Patient not taking: Reported on 04/12/2017 10/09/15   Comer Locket, PA-C    Family History Family History  Problem Relation Age of Onset  . Diabetes Mother   . Hypertension Father   . Stroke Father   . Cancer Maternal Aunt   . Cancer Maternal Uncle   . Diabetes Maternal Grandmother     Social History Social History   Tobacco Use  . Smoking status: Current Every Day Smoker    Packs/day: 0.50    Years: 20.00    Pack years: 10.00    Types: Cigarettes  . Smokeless tobacco: Never Used  Substance Use Topics  . Alcohol use: Yes    Comment: occasionally  . Drug use: Yes    Types: Marijuana  Allergies   Penicillins   Review of Systems Review of Systems  Skin: Positive for itching and rash.  All other systems reviewed and are negative.    Physical Exam Updated Vital Signs BP 136/81 (BP Location: Left Arm)   Pulse 69   Temp 98.2 F (36.8 C) (Oral)   Resp 14   Ht 5\' 7"  (1.702 m)   Wt 55.3 kg (122 lb)   LMP 04/12/2017   SpO2 99%   BMI 19.11 kg/m   Physical Exam  Constitutional: She is oriented to person, place, and time. She appears well-developed and well-nourished. No distress.  HENT:  Head: Normocephalic and atraumatic.  Mouth/Throat: Oropharynx is clear and moist.  Eyes: Conjunctivae and EOM are normal. Pupils are equal, round, and reactive to  light.  Neck: Normal range of motion. Neck supple.  Cardiovascular: Normal rate, regular rhythm and normal heart sounds.  Pulmonary/Chest: Effort normal and breath sounds normal. No respiratory distress.  Abdominal: Soft. She exhibits no distension. There is no tenderness.  Musculoskeletal: Normal range of motion. She exhibits no edema or deformity.  Neurological: She is alert and oriented to person, place, and time.  Skin: Skin is warm and dry. Rash noted.  Rash to Bilateral feet - especially in web spaces between toes - consistent with Tinea Pedis   Psychiatric: She has a normal mood and affect.  Nursing note and vitals reviewed.    ED Treatments / Results  Labs (all labs ordered are listed, but only abnormal results are displayed) Labs Reviewed - No data to display  EKG  EKG Interpretation None       Radiology No results found.  Procedures Procedures (including critical care time)  Medications Ordered in ED Medications - No data to display   Initial Impression / Assessment and Plan / ED Course  I have reviewed the triage vital signs and the nursing notes.  Pertinent labs & imaging results that were available during my care of the patient were reviewed by me and considered in my medical decision making (see chart for details).     MSE complete  Presentation today is entirely consistent with athlete's foot.  Patient educated about the home treatment for same.  Prescription given for clotrimazole.  The patient understands the need for close follow-up with her regular physician.  Strict return precautions are given and understood.  Final Clinical Impressions(s) / ED Diagnoses   Final diagnoses:  Tinea pedis of both feet    ED Discharge Orders        Ordered    clotrimazole (LOTRIMIN) 1 % cream     04/12/17 1015       Valarie Merino, MD 04/12/17 1032

## 2017-04-12 NOTE — ED Triage Notes (Signed)
Patient c/o rash and irritation to both feet, but L>R x 6 months. Patient states progressively getting worse.

## 2017-04-12 NOTE — ED Notes (Signed)
Bed: WTR9 Expected date:  Expected time:  Means of arrival:  Comments: 

## 2017-05-18 IMAGING — CR DG CHEST 2V
2 series · 2 of 2 positions shown · non-contrast
Comparison: None.

CLINICAL DATA: Chest pain for 3 days

EXAM:
CHEST  2 VIEW

[chest pa]
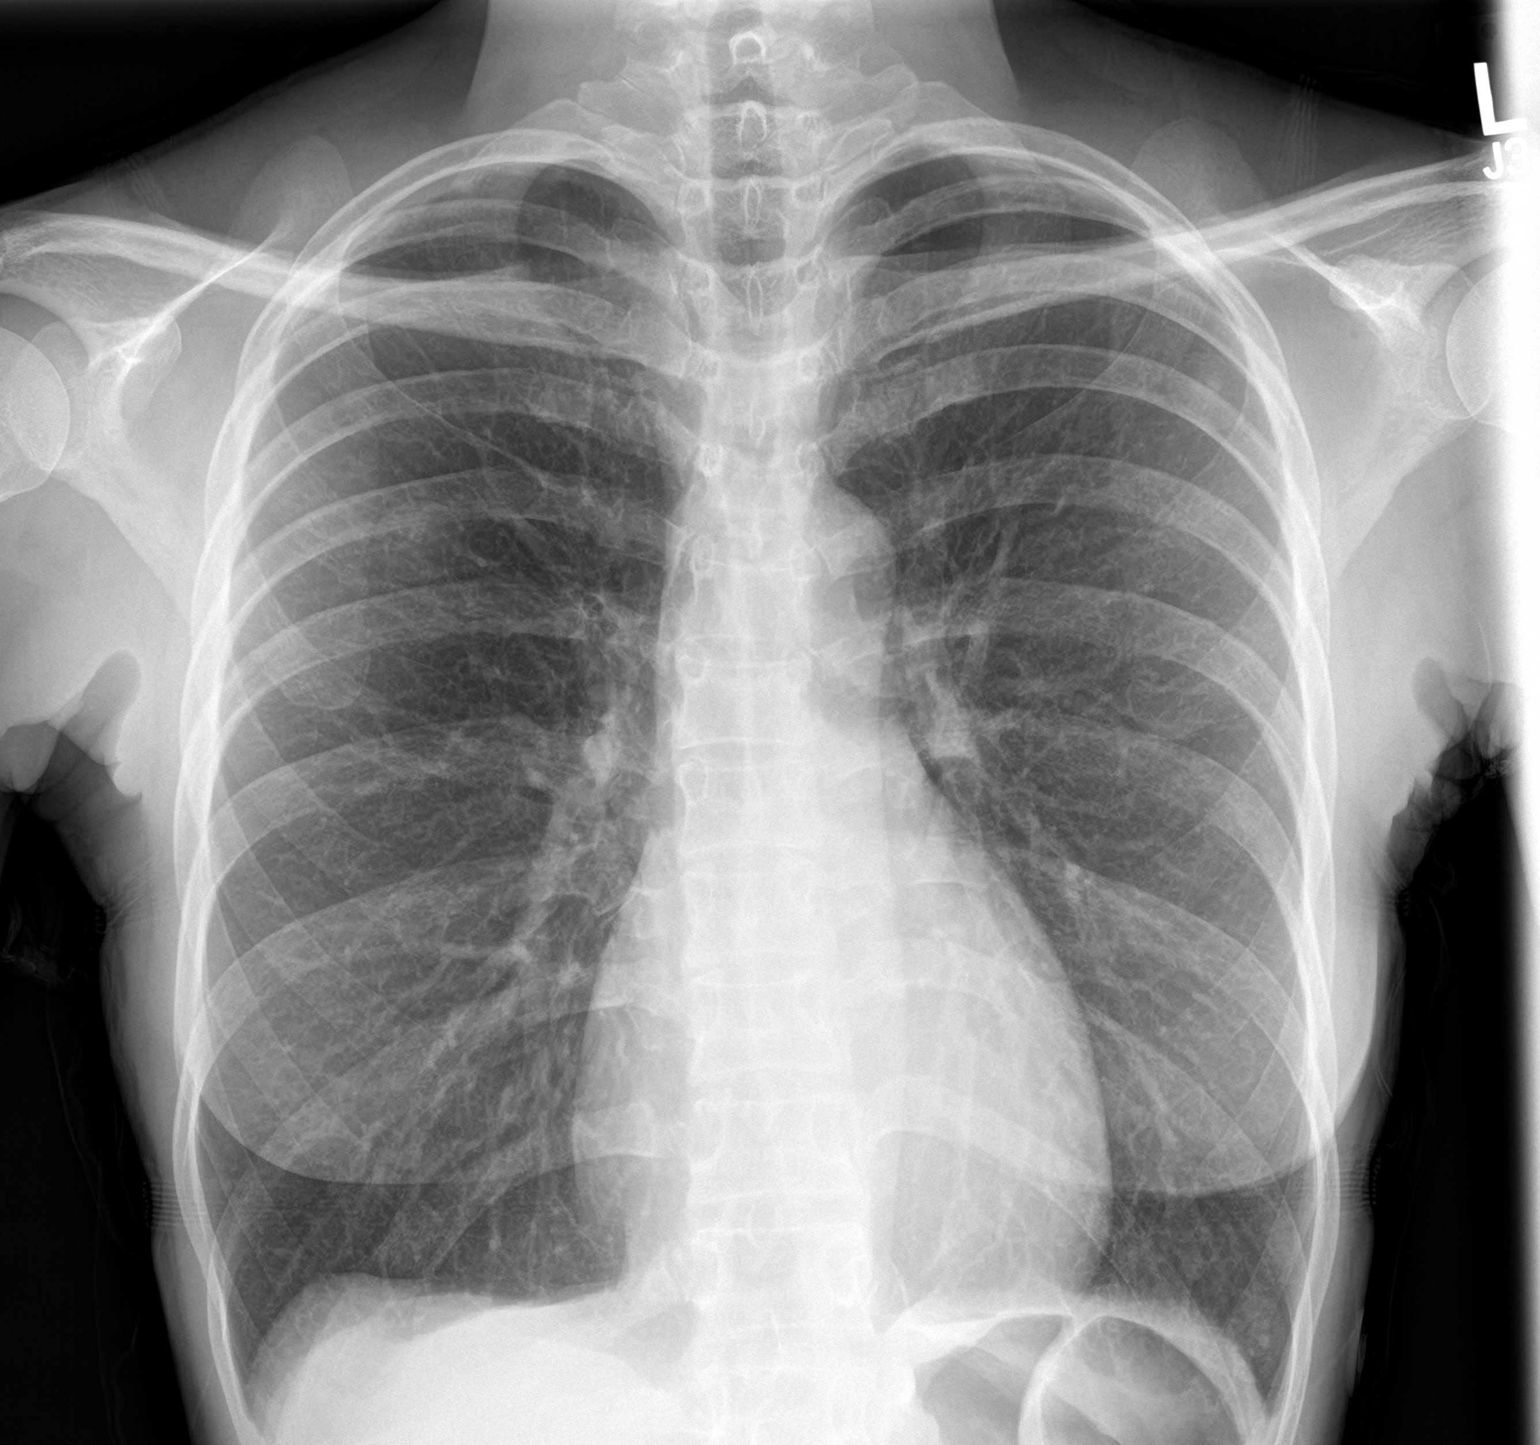

[chest lat]
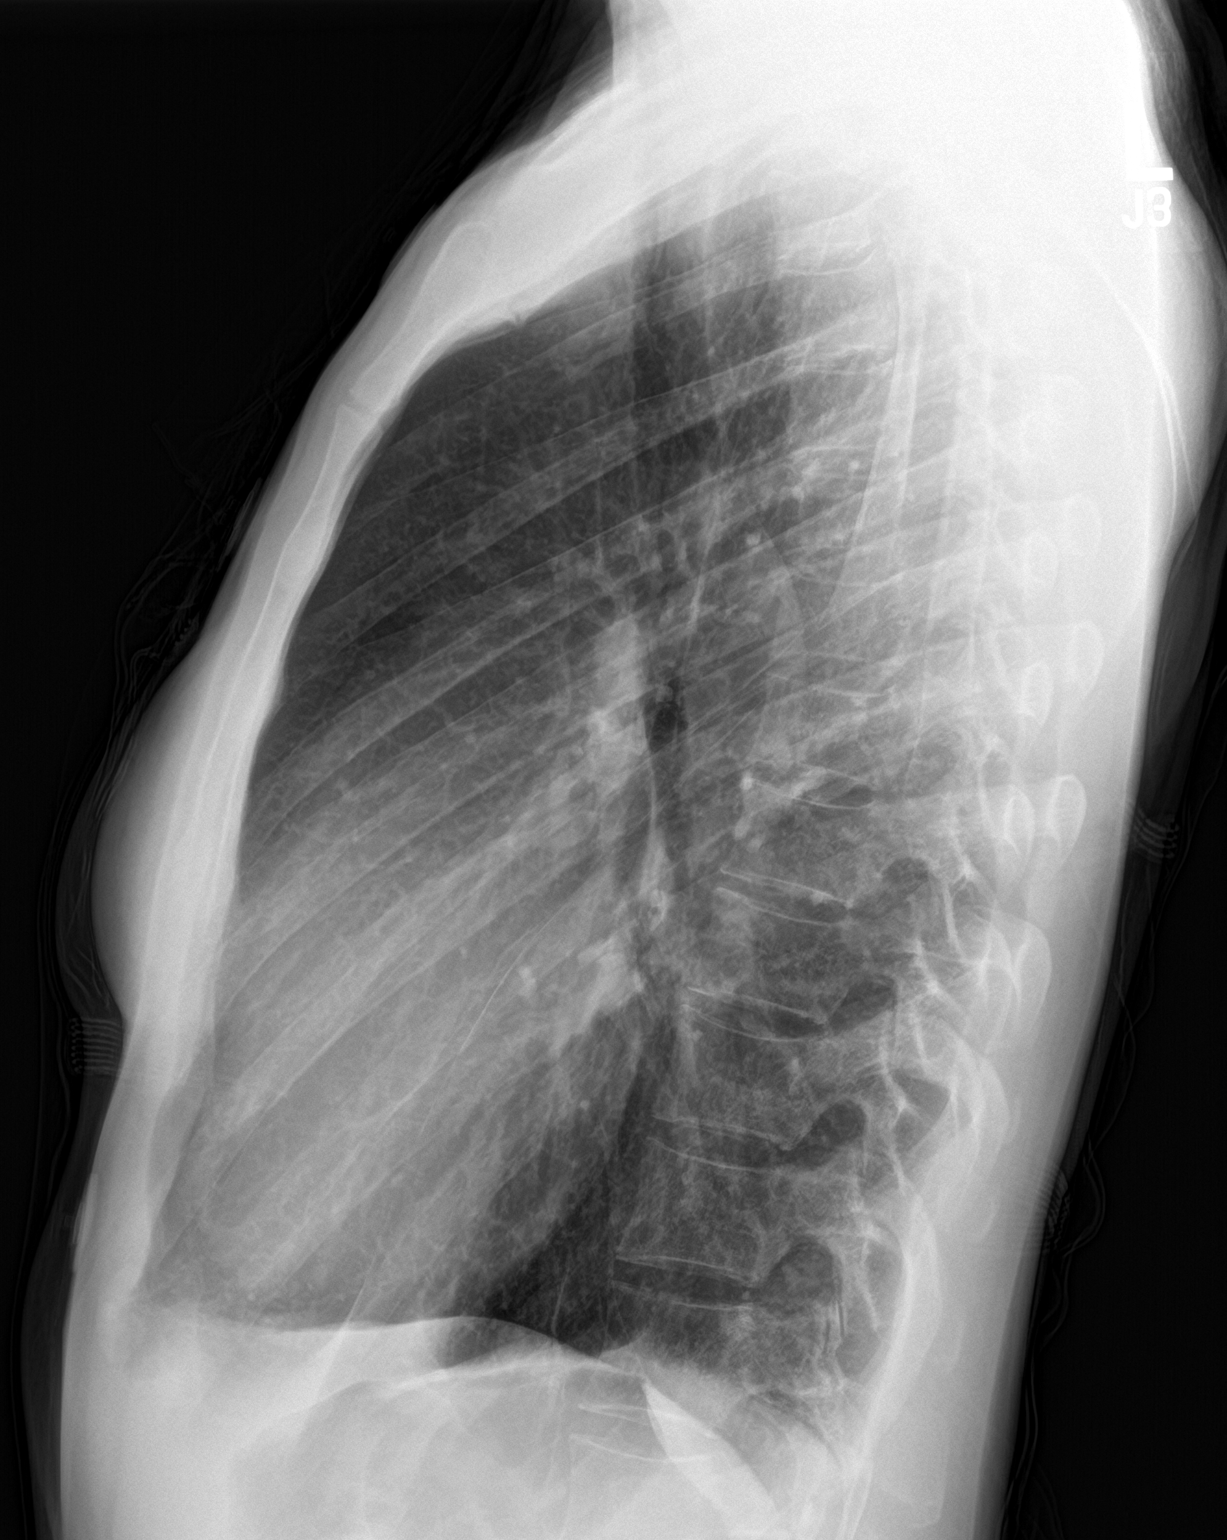

[2 of 2 positions shown; findings below may reference images not displayed]

FINDINGS: There is no appreciable edema or consolidation. The heart size and
pulmonary vascularity are normal. No adenopathy. No pneumothorax. No
bone lesions.
IMPRESSION: No edema or consolidation.

## 2017-12-07 ENCOUNTER — Other Ambulatory Visit: Payer: Self-pay

## 2017-12-07 ENCOUNTER — Emergency Department (HOSPITAL_COMMUNITY): Payer: Self-pay

## 2017-12-07 ENCOUNTER — Encounter (HOSPITAL_COMMUNITY): Payer: Self-pay

## 2017-12-07 ENCOUNTER — Emergency Department (HOSPITAL_COMMUNITY)
Admission: EM | Admit: 2017-12-07 | Discharge: 2017-12-07 | Disposition: A | Discharge status: Home / Self Care | Payer: Self-pay | Attending: Emergency Medicine | Admitting: Emergency Medicine

## 2017-12-07 DIAGNOSIS — Y999 Unspecified external cause status: Secondary | ICD-10-CM | POA: Insufficient documentation

## 2017-12-07 DIAGNOSIS — W0110XA Fall on same level from slipping, tripping and stumbling with subsequent striking against unspecified object, initial encounter: Secondary | ICD-10-CM | POA: Insufficient documentation

## 2017-12-07 DIAGNOSIS — Z79899 Other long term (current) drug therapy: Secondary | ICD-10-CM | POA: Insufficient documentation

## 2017-12-07 DIAGNOSIS — Y929 Unspecified place or not applicable: Secondary | ICD-10-CM | POA: Insufficient documentation

## 2017-12-07 DIAGNOSIS — Y9301 Activity, walking, marching and hiking: Secondary | ICD-10-CM | POA: Insufficient documentation

## 2017-12-07 DIAGNOSIS — S52591A Other fractures of lower end of right radius, initial encounter for closed fracture: Secondary | ICD-10-CM | POA: Insufficient documentation

## 2017-12-07 DIAGNOSIS — F17210 Nicotine dependence, cigarettes, uncomplicated: Secondary | ICD-10-CM | POA: Insufficient documentation

## 2017-12-07 LAB — POC URINE PREG, ED: Preg Test, Ur: NEGATIVE

## 2017-12-07 MED ORDER — HYDROCODONE-ACETAMINOPHEN 5-325 MG PO TABS: 1.0000 | ORAL_TABLET | Freq: Four times a day (QID) | ORAL | 0 refills | Status: DC | PRN

## 2017-12-07 MED ORDER — IBUPROFEN 200 MG PO TABS
600.0000 mg | ORAL_TABLET | Freq: Once | ORAL | Status: AC
  Administered 2017-12-07: 600 mg via ORAL
  Filled 2017-12-07: qty 3

## 2017-12-07 MED ORDER — IBUPROFEN 600 MG PO TABS: 600.0000 mg | ORAL_TABLET | Freq: Four times a day (QID) | ORAL | 0 refills | Status: DC | PRN

## 2017-12-07 NOTE — ED Provider Notes (Signed)
[facility] DEPT Provider Note   CSN: [id number] Arrival date & time: 12/07/17  1211     History   Chief Complaint Chief Complaint  Patient presents with  . Wrist Pain    HPI Lori Perkins is a 47 y.o. female.  HPI  Patient is a 47 year old female with a history of GERD presenting for injury to the left wrist.  Patient reports that around 6 AM, she tripped , falling backwards and catching herself with majority of her weight on her right wrist.  Patient denies any loss of consciousness, head or neck injury.  Patient reports that she is still able to move all fingers of her right hand, has full sensation in these fingers.  Patient reports that her visit to the emergency department was prompted by increasing swelling over the distal radius.  Past Medical History:  Diagnosis Date  . GERD (gastroesophageal reflux disease)   . History of abnormal cervical Pap smear 2013   2013 Pap smear LSIL+, HPV not detected, 2014 Colposcopy with benign tissue   . History of sexually transmitted disease 01/16/2014   BV, trichomonas    Patient Active Problem List   Diagnosis Date Noted  . History of sexually transmitted disease 05/17/2015  . History of abnormal cervical Pap smear 05/17/2015  . Hemorrhoids 05/17/2015  . Cold intolerance 05/17/2015  . Preventative health care 05/17/2015  . Tobacco use disorder 05/17/2015    Past Surgical History:  Procedure Laterality Date  . MULTIPLE TOOTH EXTRACTIONS       OB History   None      Home Medications    Prior to Admission medications   Medication Sig Start Date End Date Taking? Authorizing Provider  acetaminophen (TYLENOL) 500 MG tablet Take 1,000 mg by mouth every 6 (six) hours as needed for mild pain.     [provider]  clotrimazole (LOTRIMIN) 1 % cream Apply to affected area 2 times daily 04/12/17   Valarie Merino, MD  ibuprofen (ADVIL,MOTRIN) 200 MG tablet Take 200 mg by mouth every 6  (six) hours as needed.    [provider]  predniSONE (DELTASONE) 20 MG tablet Take 2 tablets (40 mg total) by mouth daily. Patient not taking: Reported on 04/12/2017 10/09/15   Comer Locket, PA-C    Family History Family History  Problem Relation Age of Onset  . Diabetes Mother   . Hypertension Father   . Stroke Father   . Cancer Maternal Aunt   . Cancer Maternal Uncle   . Diabetes Maternal Grandmother     Social History Social History   Tobacco Use  . Smoking status: Current Every Day Smoker    Packs/day: 0.50    Years: 20.00    Pack years: 10.00    Types: Cigarettes  . Smokeless tobacco: Never Used  Substance Use Topics  . Alcohol use: Yes    Comment: occasionally  . Drug use: Yes    Types: Marijuana     Allergies   Penicillins   Review of Systems Review of Systems  Musculoskeletal: Positive for arthralgias and joint swelling.  Skin: Positive for color change. Negative for wound.  Neurological: Negative for weakness and numbness.     Physical Exam Updated Vital Signs BP 108/78 (BP Location: Right Arm)   Pulse (!) 102   Temp 99.3 F (37.4 C) (Oral)   Resp 16   LMP 11/21/2017 (Approximate)   SpO2 98%   Physical Exam  Constitutional: She appears well-developed  and well-nourished. No distress.  Sitting comfortably in bed.  HENT:  Head: Normocephalic and atraumatic.  Eyes: Conjunctivae are normal. Right eye exhibits no discharge. Left eye exhibits no discharge.  EOMs normal to gross examination.  Neck: Normal range of motion.  Cardiovascular: Normal rate and regular rhythm.  Intact, 2+ radial pulse of LUE.  Pulmonary/Chest:  Normal respiratory effort. Patient converses comfortably. No audible wheeze or stridor.  Abdominal: She exhibits no distension.  Musculoskeletal:  Right Hand Exam:  Inspection: There is swelling over the distal radius, particularly over the radial styloid. Palpation: There is point tenderness overlying the radial  styloid.  Due to its proximity to this injury, difficult to assess scaphoid point tenderness. ROM: Passive/active ROM intact at wrist, MCP, PIP, and DIP joints, thumb MCP and IP joints, and no rotational deformity of metacarpals noted. Ligamentous stability: No laxity to valgus/varus stress of MCP, PIP, or DIP joints. No joint laxity with radial stress of thumb. Flexor/Extensor tendons: FDS/FDP tendons intact in digits 2-5 at PIP/DIP joints, respectively; extensor tendons intact in all digits Nerve testing:  -Radial: Sensation to light touch intact at dorsal CMC joint. -Median:Sensation to light touch intact on palmar side of 1st and 2nd digits. -Ulnar: Sensation to light touch intact on palmar side of 5th digit. Vascular: 2+ radial and ulnar pulses. Capillary refill <2 seconds b/l  Neurological: She is alert.  Cranial nerves intact to gross observation. Patient moves extremities without difficulty.  Skin: Skin is warm and dry. She is not diaphoretic.  Psychiatric: She has a normal mood and affect. Her behavior is normal. Judgment and thought content normal.  Nursing note and vitals reviewed.    ED Treatments / Results  Labs (all labs ordered are listed, but only abnormal results are displayed) Labs Reviewed - No data to display  EKG None  Radiology No results found.  Procedures .Splint Application Date/Time: 08/17/8117 1:45 PM Performed by: Remi Haggard Authorized by: Albesa Seen, PA-C   Consent:    Consent obtained:  Verbal   Consent given by:  Patient   Risks discussed:  Numbness, pain and discoloration Pre-procedure details:    Sensation:  Normal Procedure details:    Laterality:  Right   Location:  Wrist   Wrist:  R wrist   Strapping: no     Cast type:  Short arm   Splint type:  Thumb spica   Supplies:  Plaster Post-procedure details:    Pain:  Unchanged   Sensation:  Normal   Skin color:  Normal; 2+ capillary refill   Patient tolerance of procedure:   Tolerated well, no immediate complications  Assisted by A. Valere Dross, PA-C  Medications Ordered in ED Medications - No data to display   Initial Impression / Assessment and Plan / ED Course  I have reviewed the triage vital signs and the nursing notes.  Pertinent labs & imaging results that were available during my care of the patient were reviewed by me and considered in my medical decision making (see chart for details).  Clinical Course as of Dec 07 1444  Tue Dec 07, 2017  1258 Spoke with Nurse Practitioner for Dr. Lenon Curt who states that patient can follow up in one week on August 5th after immobilization in thumb spica.   [AM]  1478 I have reviewed the patient's information in the [facility] for the past 12 months and found them to have no Rx on record.  Opiates were prescribed for an acute,  painful condition. The patient was given information on side effects and encouraged to use other, non-opiate pain medication primary, only using opiate medicine sparingly for severe pain.    [AM]    Clinical Course User Index [AM] Albesa Seen, PA-C    Patient well-appearing and in no acute distress.  Patient neurovascularly intact in the left upper extremity.  Patient exhibits a distal radius fracture, intra-articular.  Per discussion above, patient is to follow-up with Dr. Lenon Curt in 1 week.  Patient placed in thumb spica splint and given splint care instructions.  Patient also given return precautions for any pallor, paresthesias, increasing pain or swelling.  Patient is in understanding and agrees with the plan of care.  Patient also provided information to follow-up with internal medicine clinic, who she has previously seen as a PCP for general health maintenance.  Final Clinical Impressions(s) / ED Diagnoses   Final diagnoses:  Other closed fracture of distal end of right radius, initial encounter    ED Discharge Orders        Ordered     HYDROcodone-acetaminophen (NORCO/VICODIN) 5-325 MG tablet  Every 6 hours PRN     12/07/17 1351    ibuprofen (ADVIL,MOTRIN) 600 MG tablet  Every 6 hours PRN     12/07/17 1351       Albesa Seen, PA-C 12/07/17 1448    Valarie Merino, MD 12/08/17 [phone]

## 2017-12-07 NOTE — ED Triage Notes (Addendum)
Pt presents with c/o fall that occurred earlier today with associated right wrist pain. Pt does have some mild swelling to her right wrist.

## 2017-12-07 NOTE — Discharge Instructions (Signed)
Please see the information and instructions below regarding your visit.  Your diagnoses today include:  1. Other closed fracture of distal end of right radius, initial encounter     Tests performed today include: See side panel of your discharge paperwork for testing performed today. Vital signs are listed at the bottom of these instructions.   X-ray showed a fracture.  Medications prescribed:    Take any prescribed medications only as prescribed, and any over the counter medications only as directed on the packaging.  You have been prescribed Norco for pain. This is an opioid pain medication. You may take this medication every 4-6 hours as needed for pain. Only take this medication if you need it for breakthrough pain. You may combine this medicine with ibuprofen, a non-steroidal anti-inflammatory drug (NSAID) every 6 hours, so you are getting something for pain relief every 3 hours.  Do not combine this medication with Tylenol, as it may increase the risk of liver problems.  Do not combine this medication with alcohol.  Please be advised to avoid driving or operating heavy machinery while taking this medication, as it may make you drowsy or impair judgment.    Home care instructions:  Please follow any educational materials contained in this packet.   Keep the splint on at all times.  Please cover it with a plastic bag and tape around her arm when you are showering. Should you have any discoloration of her fingers with your blue or white, or feel numb, first to loosen the splint wrap.  If the discoloration or loss of sensation is persistent for 15 minutes after loosening the splint, please present to the emergency department immediately.  Follow-up instructions: Follow-up with Dr. Lenon Curt in 1 week.    Follow-up with the internal medicine clinic as needed for primary care.  Return instructions:  Please return to the Emergency Department if you experience worsening symptoms.    Please return the emergency department for any increasing pain, discoloration where her fingers are white or blue, inability to feel the fingers. Please return if you have any other emergent concerns.  Additional Information:   Your vital signs today were: BP 108/78 (BP Location: Right Arm)    Pulse (!) 102    Temp 99.3 F (37.4 C) (Oral)    Resp 16    LMP 11/21/2017 (Approximate)    SpO2 98%  If your blood pressure (BP) was elevated on multiple readings during this visit above 130 for the top number or above 80 for the bottom number, please have this repeated by your primary care provider within one month. --------------  Thank you for allowing [place] to participate in your care today.

## 2017-12-07 NOTE — ED Notes (Signed)
Ortho Tech at bedside.  

## 2017-12-14 ENCOUNTER — Emergency Department (HOSPITAL_COMMUNITY)
Admission: EM | Admit: 2017-12-14 | Discharge: 2017-12-14 | Disposition: A | Discharge status: Home / Self Care | Payer: Medicaid Other | Attending: Emergency Medicine | Admitting: Emergency Medicine

## 2017-12-14 ENCOUNTER — Other Ambulatory Visit: Payer: Self-pay

## 2017-12-14 ENCOUNTER — Encounter (HOSPITAL_COMMUNITY): Payer: Self-pay

## 2017-12-14 DIAGNOSIS — S52501D Unspecified fracture of the lower end of right radius, subsequent encounter for closed fracture with routine healing: Secondary | ICD-10-CM | POA: Insufficient documentation

## 2017-12-14 DIAGNOSIS — X500XXD Overexertion from strenuous movement or load, subsequent encounter: Secondary | ICD-10-CM | POA: Insufficient documentation

## 2017-12-14 DIAGNOSIS — F17210 Nicotine dependence, cigarettes, uncomplicated: Secondary | ICD-10-CM | POA: Insufficient documentation

## 2017-12-14 NOTE — ED Triage Notes (Signed)
Patient states she broke her right wrist and was told to go to the orthopedic physician and states that they could not do anything for her. Patient took the wrapping off of her splint while in triage.

## 2017-12-14 NOTE — Discharge Instructions (Addendum)
Rest, ice, elevate, follow up with orthopedics next week.  Phone number given

## 2017-12-14 NOTE — ED Provider Notes (Signed)
[facility] DEPT Provider Note   CSN: [id number] Arrival date & time: 12/14/17  9480     History   Chief Complaint Chief Complaint  Patient presents with  . recheck wrist    HPI Lori Perkins is a 47 y.o. female.  Status post 12/07/17 with fracture of distal right radius with extension into the articulating space and thumb spica cast application.  Splint has become loose.  Complains of minimal pain.  She is able to wiggle her fingers with no decrease of sensation.  She is concerned about what to do next.  She apparently contacted an orthopedic surgeon, but was referred to the emergency department.     Past Medical History:  Diagnosis Date  . GERD (gastroesophageal reflux disease)   . History of abnormal cervical Pap smear 2013   2013 Pap smear LSIL+, HPV not detected, 2014 Colposcopy with benign tissue   . History of sexually transmitted disease 01/16/2014   BV, trichomonas    Patient Active Problem List   Diagnosis Date Noted  . History of sexually transmitted disease 05/17/2015  . History of abnormal cervical Pap smear 05/17/2015  . Hemorrhoids 05/17/2015  . Cold intolerance 05/17/2015  . Preventative health care 05/17/2015  . Tobacco use disorder 05/17/2015    Past Surgical History:  Procedure Laterality Date  . MULTIPLE TOOTH EXTRACTIONS       OB History   None      Home Medications    Prior to Admission medications   Medication Sig Start Date End Date Taking? Authorizing Provider  acetaminophen (TYLENOL) 500 MG tablet Take 1,000 mg by mouth every 6 (six) hours as needed for mild pain.    Yes [provider]  HYDROcodone-acetaminophen (NORCO/VICODIN) 5-325 MG tablet Take 1-2 tablets by mouth every 6 (six) hours as needed. 12/07/17  Yes Valere Dross, Alyssa B, PA-C  ibuprofen (ADVIL,MOTRIN) 600 MG tablet Take 1 tablet (600 mg total) by mouth every 6 (six) hours as needed. 12/07/17  Yes Valere Dross, Alyssa B, PA-C  clotrimazole  (LOTRIMIN) 1 % cream Apply to affected area 2 times daily Patient not taking: Reported on 12/14/2017 04/12/17   Valarie Merino, MD  predniSONE (DELTASONE) 20 MG tablet Take 2 tablets (40 mg total) by mouth daily. Patient not taking: Reported on 04/12/2017 10/09/15   Comer Locket, PA-C    Family History Family History  Problem Relation Age of Onset  . Diabetes Mother   . Hypertension Father   . Stroke Father   . Cancer Maternal Aunt   . Cancer Maternal Uncle   . Diabetes Maternal Grandmother     Social History Social History   Tobacco Use  . Smoking status: Current Every Day Smoker    Packs/day: 0.50    Years: 20.00    Pack years: 10.00    Types: Cigarettes  . Smokeless tobacco: Never Used  Substance Use Topics  . Alcohol use: Yes    Comment: occasionally  . Drug use: Yes    Types: Marijuana     Allergies   Penicillins   Review of Systems Review of Systems  All other systems reviewed and are negative.    Physical Exam Updated Vital Signs BP (!) 167/89   Pulse 77   Temp 98 F (36.7 C) (Oral)   Resp 18   Ht 5\' 7"  (1.702 m)   Wt 53.7 kg (118 lb 6 oz)   LMP 11/21/2017 (Approximate)   SpO2 100%   BMI 18.54 kg/m  Physical Exam  Constitutional: She is oriented to person, place, and time. She appears well-developed and well-nourished.  HENT:  Head: Normocephalic and atraumatic.  Eyes: Conjunctivae are normal.  Neck: Neck supple.  Musculoskeletal:  Right upper extremity: Tenderness of the distal radius.  Full range of motion of the digits.  Neurological: She is alert and oriented to person, place, and time.  Skin: Skin is warm and dry.  Psychiatric: She has a normal mood and affect. Her behavior is normal.  Nursing note and vitals reviewed.    ED Treatments / Results  Labs (all labs ordered are listed, but only abnormal results are displayed) Labs Reviewed - No data to display  EKG None  Radiology No results found.  Procedures Procedures  (including critical care time)  Medications Ordered in ED Medications - No data to display   Initial Impression / Assessment and Plan / ED Course  I have reviewed the triage vital signs and the nursing notes.  Pertinent labs & imaging results that were available during my care of the patient were reviewed by me and considered in my medical decision making (see chart for details).     Patient presents with follow-up for her distal right radius fracture.  Thumb spica splint reapplied.  Referral to orthopedics.  Final Clinical Impressions(s) / ED Diagnoses   Final diagnoses:  Closed fracture of distal end of right radius with routine healing, unspecified fracture morphology, subsequent encounter    ED Discharge Orders    None       Nat Christen, MD 12/14/17 1105

## 2019-04-08 ENCOUNTER — Encounter (HOSPITAL_COMMUNITY): Payer: Self-pay

## 2019-04-08 ENCOUNTER — Ambulatory Visit (HOSPITAL_COMMUNITY)
Admission: EM | Admit: 2019-04-08 | Discharge: 2019-04-08 | Disposition: A | Discharge status: Home / Self Care | Payer: Medicaid Other | Attending: Emergency Medicine | Admitting: Emergency Medicine

## 2019-04-08 ENCOUNTER — Other Ambulatory Visit: Payer: Self-pay

## 2019-04-08 DIAGNOSIS — F17210 Nicotine dependence, cigarettes, uncomplicated: Secondary | ICD-10-CM | POA: Insufficient documentation

## 2019-04-08 DIAGNOSIS — Z3202 Encounter for pregnancy test, result negative: Secondary | ICD-10-CM

## 2019-04-08 DIAGNOSIS — Z833 Family history of diabetes mellitus: Secondary | ICD-10-CM | POA: Insufficient documentation

## 2019-04-08 DIAGNOSIS — N939 Abnormal uterine and vaginal bleeding, unspecified: Secondary | ICD-10-CM

## 2019-04-08 DIAGNOSIS — Z809 Family history of malignant neoplasm, unspecified: Secondary | ICD-10-CM | POA: Insufficient documentation

## 2019-04-08 DIAGNOSIS — Z880 Allergy status to penicillin: Secondary | ICD-10-CM | POA: Insufficient documentation

## 2019-04-08 DIAGNOSIS — M549 Dorsalgia, unspecified: Secondary | ICD-10-CM | POA: Insufficient documentation

## 2019-04-08 DIAGNOSIS — M79604 Pain in right leg: Secondary | ICD-10-CM

## 2019-04-08 DIAGNOSIS — R079 Chest pain, unspecified: Secondary | ICD-10-CM | POA: Insufficient documentation

## 2019-04-08 DIAGNOSIS — M79606 Pain in leg, unspecified: Secondary | ICD-10-CM | POA: Insufficient documentation

## 2019-04-08 DIAGNOSIS — N938 Other specified abnormal uterine and vaginal bleeding: Secondary | ICD-10-CM

## 2019-04-08 LAB — POCT PREGNANCY, URINE: Preg Test, Ur: NEGATIVE

## 2019-04-08 LAB — POC URINE PREG, ED: Preg Test, Ur: NEGATIVE

## 2019-04-08 MED ORDER — IBUPROFEN 600 MG PO TABS: 600.0000 mg | ORAL_TABLET | Freq: Four times a day (QID) | ORAL | 0 refills | Status: DC | PRN

## 2019-04-08 NOTE — Discharge Instructions (Addendum)
It is reassuring that your blood pressure is elevated today, as it does not appear that you have had any excess of bleeding.  Stress may have induced some of your vaginal bleeding.  Tylenol or ibuprofen as needed for pain.  We will test the vagina to ensure there is no infection present which may be source of bleeding as well.  I would like you to follow up with your primary care provider and/or Gynecologist next week for recheck as you may need imaging.  Any worsening of bleeding, abdominal pain, dizziness or weakness please go to the ER.
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
## 2019-04-08 NOTE — ED Notes (Signed)
Vital sign reported to provider N. Burky.
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
## 2019-04-10 ENCOUNTER — Encounter (HOSPITAL_COMMUNITY): Payer: Self-pay

## 2019-04-10 ENCOUNTER — Other Ambulatory Visit: Payer: Self-pay

## 2019-04-10 ENCOUNTER — Ambulatory Visit (HOSPITAL_COMMUNITY)
Admission: EM | Admit: 2019-04-10 | Discharge: 2019-04-10 | Disposition: A | Discharge status: Home / Self Care | Payer: Medicaid Other | Attending: Family Medicine | Admitting: Family Medicine

## 2019-04-10 ENCOUNTER — Telehealth (HOSPITAL_COMMUNITY): Payer: Self-pay

## 2019-04-10 DIAGNOSIS — N938 Other specified abnormal uterine and vaginal bleeding: Secondary | ICD-10-CM

## 2019-04-10 DIAGNOSIS — D259 Leiomyoma of uterus, unspecified: Secondary | ICD-10-CM

## 2019-04-10 DIAGNOSIS — N852 Hypertrophy of uterus: Secondary | ICD-10-CM

## 2019-04-10 DIAGNOSIS — R519 Headache, unspecified: Secondary | ICD-10-CM

## 2019-04-10 DIAGNOSIS — R1030 Lower abdominal pain, unspecified: Secondary | ICD-10-CM

## 2019-04-10 DIAGNOSIS — D250 Submucous leiomyoma of uterus: Secondary | ICD-10-CM | POA: Insufficient documentation

## 2019-04-10 DIAGNOSIS — N939 Abnormal uterine and vaginal bleeding, unspecified: Secondary | ICD-10-CM | POA: Insufficient documentation

## 2019-04-10 DIAGNOSIS — D251 Intramural leiomyoma of uterus: Secondary | ICD-10-CM | POA: Insufficient documentation
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
## 2019-04-11 ENCOUNTER — Telehealth: Payer: Self-pay | Admitting: Obstetrics and Gynecology

## 2019-04-11 LAB — CERVICOVAGINAL ANCILLARY ONLY
Bacterial vaginitis: POSITIVE — AB
Candida vaginitis: POSITIVE — AB
Chlamydia: NEGATIVE
Chlamydia: NEGATIVE
Neisseria Gonorrhea: NEGATIVE
Neisseria Gonorrhea: NEGATIVE
Trichomonas: NEGATIVE
Trichomonas: NEGATIVE

## 2019-04-11 NOTE — Telephone Encounter (Signed)
The patient called in stating she needs an ultrasound per the urgent care office. Informed the patient we are a gyn office and also reviewed the notes of the visit. Informed the patient we can send a referral to ultrasound however she would need an appointment with our office. Also educated urgent care would like her to see an physician. She stated she was told to attend [facility] [place] and she would like to make an appointment there. She also stated she was told to have an ultrasound done by today. Reached out to ultrasound and provided the receptionist with the information before transferring the call.

## 2019-04-12 ENCOUNTER — Telehealth: Payer: Self-pay | Admitting: Emergency Medicine

## 2019-04-12 MED ORDER — METRONIDAZOLE 500 MG PO TABS: 500.0000 mg | ORAL_TABLET | Freq: Two times a day (BID) | ORAL | 0 refills | Status: AC

## 2019-04-12 MED ORDER — FLUCONAZOLE 150 MG PO TABS: 150.0000 mg | ORAL_TABLET | Freq: Once | ORAL | 0 refills | Status: AC

## 2019-04-12 NOTE — Telephone Encounter (Signed)
Bacterial vaginosis is positive. This was not treated at the urgent care visit.  Flagyl 500 mg BID x 7 days #14 no refills sent to patients pharmacy of choice.    Test for candida (yeast) was positive.  Prescription for fluconazole 150mg  po now, repeat dose in 3d if needed, #2 no refills, sent to the pharmacy of record.  Recheck or followup with PCP for further evaluation if symptoms are not improving.  Attempted to reach patient. No answer at this time. Call cannot be completed.

## 2019-04-13 ENCOUNTER — Other Ambulatory Visit: Payer: Self-pay

## 2019-04-13 ENCOUNTER — Emergency Department (HOSPITAL_COMMUNITY)
Admission: EM | Admit: 2019-04-13 | Discharge: 2019-04-13 | Disposition: A | Discharge status: Home / Self Care | Payer: No Typology Code available for payment source | Attending: Emergency Medicine | Admitting: Emergency Medicine

## 2019-04-13 ENCOUNTER — Emergency Department (HOSPITAL_COMMUNITY): Payer: No Typology Code available for payment source

## 2019-04-13 ENCOUNTER — Telehealth: Payer: Self-pay | Admitting: Emergency Medicine

## 2019-04-13 ENCOUNTER — Encounter (HOSPITAL_COMMUNITY): Payer: Self-pay

## 2019-04-13 DIAGNOSIS — D259 Leiomyoma of uterus, unspecified: Secondary | ICD-10-CM

## 2019-04-13 DIAGNOSIS — F17210 Nicotine dependence, cigarettes, uncomplicated: Secondary | ICD-10-CM | POA: Insufficient documentation

## 2019-04-13 DIAGNOSIS — N938 Other specified abnormal uterine and vaginal bleeding: Secondary | ICD-10-CM | POA: Diagnosis present

## 2019-04-13 DIAGNOSIS — Z79899 Other long term (current) drug therapy: Secondary | ICD-10-CM | POA: Insufficient documentation

## 2019-04-13 LAB — CBC WITH DIFFERENTIAL/PLATELET
Abs Immature Granulocytes: 0.02 10*3/uL (ref 0.00–0.07)
Basophils Absolute: 0 10*3/uL (ref 0.0–0.1)
Basophils Relative: 0 %
Eosinophils Absolute: 0.2 10*3/uL (ref 0.0–0.5)
Eosinophils Relative: 2 %
HCT: 40.5 % (ref 36.0–46.0)
Hemoglobin: 12.6 g/dL (ref 12.0–15.0)
Immature Granulocytes: 0 %
Lymphocytes Relative: 29 %
Lymphs Abs: 2.5 10*3/uL (ref 0.7–4.0)
MCH: 31.3 pg (ref 26.0–34.0)
MCHC: 31.1 g/dL (ref 30.0–36.0)
MCV: 100.7 fL — ABNORMAL HIGH (ref 80.0–100.0)
Monocytes Absolute: 0.8 10*3/uL (ref 0.1–1.0)
Monocytes Relative: 10 %
Neutro Abs: 5 10*3/uL (ref 1.7–7.7)
Neutrophils Relative %: 59 %
Platelets: 386 10*3/uL (ref 150–400)
RBC: 4.02 MIL/uL (ref 3.87–5.11)
RDW: 15 % (ref 11.5–15.5)
WBC: 8.5 10*3/uL (ref 4.0–10.5)
nRBC: 0 % (ref 0.0–0.2)

## 2019-04-13 LAB — BASIC METABOLIC PANEL
Anion gap: 7 (ref 5–15)
BUN: 11 mg/dL (ref 6–20)
CO2: 28 mmol/L (ref 22–32)
Calcium: 9.1 mg/dL (ref 8.9–10.3)
Chloride: 103 mmol/L (ref 98–111)
Creatinine, Ser: 0.8 mg/dL (ref 0.44–1.00)
GFR calc Af Amer: >60 mL/min (ref >60)
GFR calc non Af Amer: >60 mL/min (ref >60)
Glucose, Bld: 103 mg/dL — ABNORMAL HIGH (ref 70–99)
Potassium: 4.3 mmol/L (ref 3.5–5.1)
Sodium: 138 mmol/L (ref 135–145)

## 2019-04-13 LAB — PROTIME-INR
INR: 0.9 (ref 0.8–1.2)
Prothrombin Time: 12 seconds (ref 11.4–15.2)

## 2019-04-13 MED ORDER — MEGESTROL ACETATE 20 MG PO TABS: 20.0000 mg | ORAL_TABLET | Freq: Two times a day (BID) | ORAL | 0 refills | Status: DC

## 2019-04-13 MED ORDER — MEGESTROL ACETATE 20 MG PO TABS
20.0000 mg | ORAL_TABLET | Freq: Two times a day (BID) | ORAL | Status: DC
  Filled 2019-04-13: qty 1

## 2019-04-13 NOTE — ED Notes (Signed)
ED Provider at bedside. 

## 2019-04-13 NOTE — Discharge Instructions (Signed)
We saw the ER for vaginal bleeding. Ultrasound shows that you have a fibroid.  We suspect that that is the cause of your bleeding. Please take the medications that are prescribed.  Follow-up with the gynecologist on 21st December as planned.

## 2019-04-13 NOTE — ED Notes (Signed)
Pt states she has vaginal bleeding, rectal bleeding and generalized body pain on her right side.
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
## 2019-04-13 NOTE — Telephone Encounter (Signed)
Attempted to reach patient x2. No answer at this time. Voicemail left.    

## 2019-04-17 ENCOUNTER — Telehealth (HOSPITAL_COMMUNITY): Payer: Self-pay | Admitting: Emergency Medicine

## 2019-04-17 NOTE — Telephone Encounter (Signed)
Was able to reach patient and notify her by phone of her results on 04/08/2019. All questions answered.

## 2019-05-01 ENCOUNTER — Ambulatory Visit (INDEPENDENT_AMBULATORY_CARE_PROVIDER_SITE_OTHER): Payer: Self-pay | Admitting: Obstetrics & Gynecology

## 2019-05-01 ENCOUNTER — Other Ambulatory Visit: Payer: Self-pay

## 2019-05-01 ENCOUNTER — Encounter: Payer: Self-pay | Admitting: Obstetrics & Gynecology

## 2019-05-01 VITALS — BP 147/100 | HR 92

## 2019-05-01 DIAGNOSIS — N920 Excessive and frequent menstruation with regular cycle: Secondary | ICD-10-CM | POA: Insufficient documentation

## 2019-05-01 DIAGNOSIS — Z1331 Encounter for screening for depression: Secondary | ICD-10-CM

## 2019-05-01 MED ORDER — MEGESTROL ACETATE 20 MG PO TABS: 20.0000 mg | ORAL_TABLET | Freq: Two times a day (BID) | ORAL | 0 refills | Status: DC

## 2019-05-01 MED ORDER — NAPROXEN 500 MG PO TABS: 500.0000 mg | ORAL_TABLET | Freq: Two times a day (BID) | ORAL | 3 refills | Status: DC | PRN

## 2019-05-01 NOTE — Patient Instructions (Addendum)
Please call and make a new patient appointment with either of the following offices:   [facility] at [facility] [address] [place],  [place]  [address] [address] [place],  [place]  [address] [phone]  Endometrial Biopsy  Endometrial biopsy is a procedure in which a tissue sample is taken from inside the uterus. The sample is taken from the endometrium, which is the lining of the uterus. The tissue sample is then checked under a microscope to see if the tissue is normal or abnormal. This procedure helps to determine where you are in your menstrual cycle and how hormone levels are affecting the lining of the uterus. This procedure may also be used to evaluate uterine bleeding or to diagnose endometrial cancer, endometrial tuberculosis, polyps, or other inflammatory conditions. Tell a health care provider about:  Any allergies you have.  All medicines you are taking, including vitamins, herbs, eye drops, creams, and over-the-counter medicines.  Any problems you or family members have had with anesthetic medicines.  Any blood disorders you have.  Any surgeries you have had.  Any medical conditions you have.  Whether you are pregnant or may be pregnant. What are the risks? Generally, this is a safe procedure. However, problems may occur, including:  Bleeding.  Pelvic infection.  Puncture of the wall of the uterus with the biopsy device (rare). What happens before the procedure?  Keep a record of your menstrual cycles as told by your health care provider. You may need to schedule your procedure for a specific time in your cycle.  You may want to bring a sanitary pad to wear after the procedure.  Ask your health care provider about: ? Changing or stopping your regular medicines. This is especially important if you are taking diabetes medicines or blood thinners. ? Taking medicines such as  aspirin and ibuprofen. These medicines can thin your blood. Do not take these medicines before your procedure if your health care provider instructs you not to.  Plan to have someone take you home from the hospital or clinic. What happens during the procedure?  To lower your risk of infection: ? Your health care team will wash or sanitize their hands.  You will lie on an exam table with your feet and legs supported as in a pelvic exam.  Your health care provider will insert an instrument (speculum) into your vagina to see your cervix.  Your cervix will be cleansed with an antiseptic solution.  A medicine (local anesthetic) will be used to numb the cervix.  A forceps instrument (tenaculum) will be used to hold your cervix steady for the biopsy.  A thin, rod-like instrument (uterine sound) will be inserted through your cervix to determine the length of your uterus and the location where the biopsy sample will be removed.  A thin, flexible tube (catheter) will be inserted through your cervix and into the uterus. The catheter will be used to collect the biopsy sample from your endometrial tissue.  The catheter and speculum will then be removed, and the tissue sample will be sent to a lab for examination. What happens after the procedure?  You will rest in a recovery area until you are ready to go home.  You may have mild cramping and a small amount of vaginal bleeding. This is normal.  It is up to you to get the results of your procedure. Ask your health care provider, or the department that  is doing the procedure, when your results will be ready. Summary  Endometrial biopsy is a procedure in which a tissue sample is taken from the endometrium, which is the lining of the uterus.  This procedure may help to diagnose menstrual cycle problems, abnormal bleeding, or other conditions affecting the endometrium.  Before the procedure, keep a record of your menstrual cycles as told by your  health care provider.  The tissue sample that is removed will be checked under a microscope to see if it is normal or abnormal. This information is not intended to replace advice given to you by your health care provider. Make sure you discuss any questions you have with your health care provider. Document Released: 08/28/2004 Document Revised: 04/09/2017 Document Reviewed: 05/13/2016 Elsevier Patient Education  2020 [employer].

## 2019-05-01 NOTE — Addendum Note (Signed)
Addended by: Emily Filbert on: 05/01/2019 01:53 PM   Modules accepted: Orders

## 2019-05-01 NOTE — Addendum Note (Signed)
Addended by: Annabell Howells on: 05/01/2019 04:37 PM   Modules accepted: Orders

## 2019-05-01 NOTE — Progress Notes (Addendum)
Patient ID: Lori Perkins, female   DOB: 1970/06/11, 48 y.o.   MRN: [id number]  No chief complaint on file.   HPI Lori Perkins is a 48 y.o.single P0 here today as a follow up after an ED visit on 04/13/19 for heavy bleeding. Her periods are regular but have been heavier over the last 2-4 years. She had an u/s recently that showed a 5 cm submucosal fibroid. She was prescribed 10 days of megace which helped her bleeding.   Past Medical History:  Diagnosis Date  . GERD (gastroesophageal reflux disease)   . History of abnormal cervical Pap smear 2013   2013 Pap smear LSIL+, HPV not detected, 2014 Colposcopy with benign tissue   . History of sexually transmitted disease 01/16/2014   BV, trichomonas    Past Surgical History:  Procedure Laterality Date  . MULTIPLE TOOTH EXTRACTIONS      Family History  Problem Relation Age of Onset  . Diabetes Mother   . Hypertension Father   . Stroke Father   . Cancer Maternal Aunt   . Cancer Maternal Uncle   . Diabetes Maternal Grandmother     Social History Social History   Tobacco Use  . Smoking status: Current Every Day Smoker    Packs/day: 0.50    Years: 20.00    Pack years: 10.00    Types: Cigarettes  . Smokeless tobacco: Never Used  Substance Use Topics  . Alcohol use: Yes    Comment: occasionally  . Drug use: Yes    Types: Marijuana    Allergies  Allergen Reactions  . Penicillins     Has patient had a PCN reaction causing immediate rash, facial/tongue/throat swelling, SOB or lightheadedness with hypotension: Unknown Has patient had a PCN reaction causing severe rash involving mucus membranes or skin necrosis: Unknown Has patient had a PCN reaction that required hospitalization: Unknown Has patient had a PCN reaction occurring within the last 10 years: No If all of the above answers are "NO", then may proceed with Cephalosporin use.    Current Outpatient Medications  Medication Sig Dispense Refill  . acetaminophen  (TYLENOL) 500 MG tablet Take 1,000 mg by mouth every 6 (six) hours as needed for mild pain.     Marland Kitchen ibuprofen (ADVIL) 600 MG tablet Take 1 tablet (600 mg total) by mouth every 6 (six) hours as needed. 30 tablet 0  . megestrol (MEGACE) 20 MG tablet Take 1 tablet (20 mg total) by mouth 2 (two) times daily. (Patient not taking: Reported on 05/01/2019) 10 tablet 0  . naproxen (NAPROSYN) 500 MG tablet Take 500 mg by mouth 2 (two) times daily as needed for mild pain.     No current facility-administered medications for this visit.    Review of Systems Review of Systems Last pap was 2013 with a colpo in 2014 (benign tissue) She uses condoms (not always) She is currently unemployed, was at [employer]. Monogamous for 7+ years She denies dyspareunia. Never has had a mammogram  Blood pressure (!) 147/100, pulse 92, last menstrual period 04/24/2019.  Physical Exam Physical Exam  Data Reviewed  ultrasound and labs  Assessment Menorrhagia with normal HBG, 5 cm submucosal fibroid HTN  Plan Refer to fam med for BP control I sent a message to Etheleen Sia to get her into [facility] for free mammo and pap smear She will come back for [facility] when she has not had unprotected sex for 2 weeks She will fill out paperwork for financial aid Refill  of megace and naproxyn   Khiyan Crace C Gennette Shadix 05/01/2019, 1:40 PM

## 2019-05-03 ENCOUNTER — Telehealth (HOSPITAL_COMMUNITY): Payer: Self-pay

## 2019-05-03 NOTE — Telephone Encounter (Signed)
Telephoned patient at home number. Left a message to call and schedule with [facility]. 

## 2019-05-15 ENCOUNTER — Telehealth (HOSPITAL_COMMUNITY): Payer: Self-pay

## 2019-05-15 NOTE — Telephone Encounter (Signed)
Telephoned patient at home number.  Left a message for patient to call and schedule with [facility].

## 2019-05-16 ENCOUNTER — Telehealth (INDEPENDENT_AMBULATORY_CARE_PROVIDER_SITE_OTHER): Payer: Self-pay | Admitting: Obstetrics & Gynecology

## 2019-05-16 DIAGNOSIS — N920 Excessive and frequent menstruation with regular cycle: Secondary | ICD-10-CM

## 2019-05-16 NOTE — Telephone Encounter (Signed)
Patient is requesting a call back to explain to her what an Endo Bx is.
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
## 2019-05-17 ENCOUNTER — Other Ambulatory Visit (HOSPITAL_COMMUNITY): Payer: Self-pay | Admitting: *Deleted

## 2019-05-17 DIAGNOSIS — N644 Mastodynia: Secondary | ICD-10-CM

## 2019-05-17 NOTE — BH Specialist Note (Signed)
Pt did not arrive to video visit and did not answer the phone ; Left HIPPA-compliant message at [phone] (after call would not go through at [phone]) to call back Lori Perkins from [facility] at [phone].  ; left MyChart message for patient.    Lori Perkins via Telemedicine Video Visit  05/17/2019 EARLE SIERZEGA [id number]   Lori Perkins

## 2019-05-18 ENCOUNTER — Telehealth: Payer: Self-pay | Admitting: Obstetrics & Gynecology

## 2019-05-18 NOTE — Telephone Encounter (Signed)
Received a call from Ms. Lori Perkins to reschedule her appointment. She stated she had another appointment that was overlapped with the appointment we had scheduled in our office.

## 2019-05-22 ENCOUNTER — Ambulatory Visit: Payer: Self-pay | Admitting: Clinical

## 2019-05-22 ENCOUNTER — Ambulatory Visit: Payer: Medicaid Other | Admitting: Women's Health

## 2019-05-22 DIAGNOSIS — Z91199 Patient's noncompliance with other medical treatment and regimen due to unspecified reason: Secondary | ICD-10-CM

## 2019-05-22 DIAGNOSIS — Z5329 Procedure and treatment not carried out because of patient's decision for other reasons: Secondary | ICD-10-CM

## 2019-05-26 ENCOUNTER — Other Ambulatory Visit: Payer: Self-pay | Admitting: Obstetrics & Gynecology

## 2019-06-01 ENCOUNTER — Ambulatory Visit
Admission: RE | Admit: 2019-06-01 | Discharge: 2019-06-01 | Disposition: A | Discharge status: Home / Self Care | Payer: No Typology Code available for payment source | Source: Ambulatory Visit | Attending: Obstetrics and Gynecology | Admitting: Obstetrics and Gynecology

## 2019-06-01 ENCOUNTER — Other Ambulatory Visit: Payer: Self-pay

## 2019-06-01 ENCOUNTER — Other Ambulatory Visit (HOSPITAL_COMMUNITY): Payer: Self-pay | Admitting: Obstetrics and Gynecology

## 2019-06-01 ENCOUNTER — Ambulatory Visit
Admission: RE | Admit: 2019-06-01 | Discharge: 2019-06-01 | Disposition: A | Discharge status: Home / Self Care | Payer: Medicaid Other | Source: Ambulatory Visit | Attending: Obstetrics and Gynecology | Admitting: Obstetrics and Gynecology

## 2019-06-01 ENCOUNTER — Ambulatory Visit (HOSPITAL_COMMUNITY)
Admission: RE | Admit: 2019-06-01 | Discharge: 2019-06-01 | Disposition: A | Discharge status: Home / Self Care | Payer: Medicaid Other | Source: Ambulatory Visit | Attending: Obstetrics and Gynecology | Admitting: Obstetrics and Gynecology

## 2019-06-01 ENCOUNTER — Other Ambulatory Visit: Payer: Self-pay | Admitting: Obstetrics and Gynecology

## 2019-06-01 ENCOUNTER — Encounter (HOSPITAL_COMMUNITY): Payer: Self-pay

## 2019-06-01 DIAGNOSIS — N6310 Unspecified lump in the right breast, unspecified quadrant: Secondary | ICD-10-CM

## 2019-06-01 DIAGNOSIS — N644 Mastodynia: Secondary | ICD-10-CM

## 2019-06-01 DIAGNOSIS — Z01419 Encounter for gynecological examination (general) (routine) without abnormal findings: Secondary | ICD-10-CM | POA: Insufficient documentation

## 2019-06-01 NOTE — Patient Instructions (Signed)
Explained breast self awareness with [place]. Let patient know that if today's Pap smear is normal that her next Pap smear is due in one year due to her last Pap smear was abnormal. Referred patient to the [facility] for a diagnostic mammogram. Appointment scheduled for Thursday, June 01, 2019 at 1520. Patient aware of appointment and will be there. Let patient know will follow up with her within the next couple weeks with results of her Pap smear by letter or phone. Discussed smoking cessation with patient. Referred to the [facility] Quitline and gave resources to the free smoking cessation classes at [facility]. [place] verbalized understanding.  Emmanuela Ghazi, Arvil [place], RN 2:34 PM

## 2019-06-01 NOTE — Progress Notes (Addendum)
Complaints of bilateral breast pain since the end of the November 2020.   Pap Smear: Pap smear completed today. Last Pap smear was 03/28/2012 at the [facility] for [place] and LSIL with negative HPV with a few cells suggestive of a higher grade lesion. Patient had a colposcopy to follow-up for her abnormal Pap smear 06/06/2012 that was benign. Per patient her last Pap smear is the only abnormal Pap smear she has had. Last  Pap smear and colposcopy results are in Epic.  Physical exam: Breasts Breasts symmetrical. No skin abnormalities bilateral breasts. No nipple retraction bilateral breasts. No nipple discharge bilateral breasts. No lymphadenopathy. No lumps palpated bilateral breasts. Complaints of bilateral diffuse breast pain on exam. Referred patient to the [facility] for a diagnostic mammogram. Appointment scheduled for Thursday, June 01, 2019 at 1520.        Pelvic/Bimanual   Ext Genitalia No lesions, no swelling and no discharge observed on external genitalia.         Vagina Vagina pink and normal texture. No lesions or discharge observed in vagina.          Cervix Cervix is present. Cervix pink and of normal texture. No discharge observed.     Uterus Uterus is present and palpable. Uterus in normal position and normal size.        Adnexae Bilateral ovaries present and palpable. No tenderness on palpation.         Rectovaginal No rectal exam completed today since patient had no rectal complaints. No skin abnormalities observed on exam.    Smoking History: Patient is a current smoker. Discussed smoking cessation with patient. Referred to the [facility] Quitline and gave resources to the free smoking cessation classes at [facility].  Patient Navigation: Patient education provided. Access to services provided for patient through [facility] program.   Breast and Cervical Cancer Risk Assessment: Patient has a family history of her maternal grandmother and  three maternal aunts having breast cancer. Patient has no known genetic mutations or history of radiation treatment to the chest before age 51. Patient has no history of cervical dysplasia, immunocompromised, or DES exposure in-utero.  Risk Assessment    Risk Scores      06/01/2019   Last edited by: Demetrius Revel, LPN   5-year risk: 0.8 %   Lifetime risk: 7 %

## 2019-06-01 NOTE — Addendum Note (Signed)
Encounter addended by: Loletta Parish, RN on: 06/01/2019 4:01 PM  Actions taken: Clinical Note Signed

## 2019-06-06 LAB — CYTOLOGY - PAP
Comment: NEGATIVE
Diagnosis: NEGATIVE
High risk HPV: NEGATIVE

## 2019-06-14 ENCOUNTER — Telehealth (HOSPITAL_COMMUNITY): Payer: Self-pay | Admitting: *Deleted

## 2019-06-14 NOTE — Telephone Encounter (Signed)
Normal Pap smear and negative HPV result letter mailed to patient 06/14/2019.

## 2019-06-22 ENCOUNTER — Encounter: Payer: Self-pay | Admitting: Obstetrics & Gynecology

## 2019-06-22 ENCOUNTER — Other Ambulatory Visit: Payer: Self-pay

## 2019-06-22 ENCOUNTER — Ambulatory Visit (INDEPENDENT_AMBULATORY_CARE_PROVIDER_SITE_OTHER): Payer: Self-pay | Admitting: Obstetrics & Gynecology

## 2019-06-22 VITALS — BP 164/100 | HR 90 | Ht 67.0 in | Wt 121.0 lb

## 2019-06-22 DIAGNOSIS — I10 Essential (primary) hypertension: Secondary | ICD-10-CM

## 2019-06-22 DIAGNOSIS — D219 Benign neoplasm of connective and other soft tissue, unspecified: Secondary | ICD-10-CM
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
## 2019-07-25 ENCOUNTER — Encounter: Payer: Self-pay | Admitting: Family Medicine

## 2019-07-25 ENCOUNTER — Other Ambulatory Visit: Payer: Self-pay

## 2019-07-25 ENCOUNTER — Ambulatory Visit: Payer: Self-pay | Attending: Family Medicine | Admitting: Family Medicine

## 2019-07-25 VITALS — BP 121/67 | HR 80 | Ht 67.0 in | Wt 125.0 lb

## 2019-07-25 DIAGNOSIS — D219 Benign neoplasm of connective and other soft tissue, unspecified: Secondary | ICD-10-CM

## 2019-07-25 DIAGNOSIS — R030 Elevated blood-pressure reading, without diagnosis of hypertension: Secondary | ICD-10-CM

## 2019-07-25 DIAGNOSIS — F17210 Nicotine dependence, cigarettes, uncomplicated: Secondary | ICD-10-CM

## 2019-07-25 DIAGNOSIS — N938 Other specified abnormal uterine and vaginal bleeding: Secondary | ICD-10-CM

## 2019-07-25 DIAGNOSIS — N939 Abnormal uterine and vaginal bleeding, unspecified: Secondary | ICD-10-CM

## 2019-07-25 DIAGNOSIS — F17200 Nicotine dependence, unspecified, uncomplicated: Secondary | ICD-10-CM

## 2019-07-25 DIAGNOSIS — M542 Cervicalgia: Secondary | ICD-10-CM
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
## 2019-07-25 NOTE — Patient Instructions (Signed)
DASH Eating Plan DASH stands for "Dietary Approaches to Stop Hypertension." The DASH eating plan is a healthy eating plan that has been shown to reduce high blood pressure (hypertension). It may also reduce your risk for type 2 diabetes, heart disease, and stroke. The DASH eating plan may also help with weight loss. What are tips for following this plan?  General guidelines  Avoid eating more than 2,300 mg (milligrams) of salt (sodium) a day. If you have hypertension, you may need to reduce your sodium intake to 1,500 mg a day.  Limit alcohol intake to no more than 1 drink a day for nonpregnant women and 2 drinks a day for men. One drink equals 12 oz of beer, 5 oz of wine, or 1 oz of hard liquor.  Work with your health care provider to maintain a healthy body weight or to lose weight. Ask what an ideal weight is for you.  Get at least 30 minutes of exercise that causes your heart to beat faster (aerobic exercise) most days of the week. Activities may include walking, swimming, or biking.  Work with your health care provider or diet and nutrition specialist (dietitian) to adjust your eating plan to your individual calorie needs. Reading food labels   Check food labels for the amount of sodium per serving. Choose foods with less than 5 percent of the Daily Value of sodium. Generally, foods with less than 300 mg of sodium per serving fit into this eating plan.  To find whole grains, look for the word "whole" as the first word in the ingredient list. Shopping  Buy products labeled as "low-sodium" or "no salt added."  Buy fresh foods. Avoid canned foods and premade or frozen meals. Cooking  Avoid adding salt when cooking. Use salt-free seasonings or herbs instead of table salt or sea salt. Check with your health care provider or pharmacist before using salt substitutes.  Do not fry foods. Cook foods using healthy methods such as baking, boiling, grilling, and broiling instead.  Cook with  heart-healthy oils, such as olive, canola, soybean, or sunflower oil. Meal planning  Eat a balanced diet that includes: ? 5 or more servings of fruits and vegetables each day. At each meal, try to fill half of your plate with fruits and vegetables. ? Up to 6-8 servings of whole grains each day. ? Less than 6 oz of lean meat, poultry, or fish each day. A 3-oz serving of meat is about the same size as a deck of cards. One egg equals 1 oz. ? 2 servings of low-fat dairy each day. ? A serving of nuts, seeds, or beans 5 times each week. ? Heart-healthy fats. Healthy fats called Omega-3 fatty acids are found in foods such as flaxseeds and coldwater fish, like sardines, salmon, and mackerel.  Limit how much you eat of the following: ? Canned or prepackaged foods. ? Food that is high in trans fat, such as fried foods. ? Food that is high in saturated fat, such as fatty meat. ? Sweets, desserts, sugary drinks, and other foods with added sugar. ? Full-fat dairy products.  Do not salt foods before eating.  Try to eat at least 2 vegetarian meals each week.  Eat more home-cooked food and less restaurant, buffet, and fast food.  When eating at a restaurant, ask that your food be prepared with less salt or no salt, if possible. What foods are recommended? The items listed may not be a complete list. Talk with your dietitian about   what dietary choices are best for you. Grains Whole-grain or whole-wheat bread. Whole-grain or whole-wheat pasta. Skates rice. Oatmeal. Quinoa. Bulgur. Whole-grain and low-sodium cereals. Pita bread. Low-fat, low-sodium crackers. Whole-wheat flour tortillas. Vegetables Fresh or frozen vegetables (raw, steamed, roasted, or grilled). Low-sodium or reduced-sodium tomato and vegetable juice. Low-sodium or reduced-sodium tomato sauce and tomato paste. Low-sodium or reduced-sodium canned vegetables. Fruits All fresh, dried, or frozen fruit. Canned fruit in natural juice (without  added sugar). Meat and other protein foods Skinless chicken or turkey. Ground chicken or turkey. Pork with fat trimmed off. Fish and seafood. Egg whites. Dried beans, peas, or lentils. Unsalted nuts, nut butters, and seeds. Unsalted canned beans. Lean cuts of beef with fat trimmed off. Low-sodium, lean deli meat. Dairy Low-fat (1%) or fat-free (skim) milk. Fat-free, low-fat, or reduced-fat cheeses. Nonfat, low-sodium ricotta or cottage cheese. Low-fat or nonfat yogurt. Low-fat, low-sodium cheese. Fats and oils Soft margarine without trans fats. Vegetable oil. Low-fat, reduced-fat, or light mayonnaise and salad dressings (reduced-sodium). Canola, safflower, olive, soybean, and sunflower oils. Avocado. Seasoning and other foods Herbs. Spices. Seasoning mixes without salt. Unsalted popcorn and pretzels. Fat-free sweets. What foods are not recommended? The items listed may not be a complete list. Talk with your dietitian about what dietary choices are best for you. Grains Baked goods made with fat, such as croissants, muffins, or some breads. Dry pasta or rice meal packs. Vegetables Creamed or fried vegetables. Vegetables in a cheese sauce. Regular canned vegetables (not low-sodium or reduced-sodium). Regular canned tomato sauce and paste (not low-sodium or reduced-sodium). Regular tomato and vegetable juice (not low-sodium or reduced-sodium). Pickles. Olives. Fruits Canned fruit in a light or heavy syrup. Fried fruit. Fruit in cream or butter sauce. Meat and other protein foods Fatty cuts of meat. Ribs. Fried meat. Bacon. Sausage. Bologna and other processed lunch meats. Salami. Fatback. Hotdogs. Bratwurst. Salted nuts and seeds. Canned beans with added salt. Canned or smoked fish. Whole eggs or egg yolks. Chicken or turkey with skin. Dairy Whole or 2% milk, cream, and half-and-half. Whole or full-fat cream cheese. Whole-fat or sweetened yogurt. Full-fat cheese. Nondairy creamers. Whipped toppings.  Processed cheese and cheese spreads. Fats and oils Butter. Stick margarine. Lard. Shortening. Ghee. Bacon fat. Tropical oils, such as coconut, palm kernel, or palm oil. Seasoning and other foods Salted popcorn and pretzels. Onion salt, garlic salt, seasoned salt, table salt, and sea salt. Worcestershire sauce. Tartar sauce. Barbecue sauce. Teriyaki sauce. Soy sauce, including reduced-sodium. Steak sauce. Canned and packaged gravies. Fish sauce. Oyster sauce. Cocktail sauce. Horseradish that you find on the shelf. Ketchup. Mustard. Meat flavorings and tenderizers. Bouillon cubes. Hot sauce and Tabasco sauce. Premade or packaged marinades. Premade or packaged taco seasonings. Relishes. Regular salad dressings. Where to find more information:  National Heart, Lung, and Blood Institute: www.nhlbi.nih.gov  American Heart Association: www.heart.org Summary  The DASH eating plan is a healthy eating plan that has been shown to reduce high blood pressure (hypertension). It may also reduce your risk for type 2 diabetes, heart disease, and stroke.  With the DASH eating plan, you should limit salt (sodium) intake to 2,300 mg a day. If you have hypertension, you may need to reduce your sodium intake to 1,500 mg a day.  When on the DASH eating plan, aim to eat more fresh fruits and vegetables, whole grains, lean proteins, low-fat dairy, and heart-healthy fats.  Work with your health care provider or diet and nutrition specialist (dietitian) to adjust your eating plan to your   individual calorie needs. This information is not intended to replace advice given to you by your health care provider. Make sure you discuss any questions you have with your health care provider. Document Revised: 04/09/2017 Document Reviewed: 04/20/2016 Elsevier Patient Education  2020 Elsevier Inc.  
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
## 2019-08-16 ENCOUNTER — Telehealth: Payer: Self-pay | Admitting: *Deleted

## 2019-08-16 ENCOUNTER — Ambulatory Visit: Payer: No Typology Code available for payment source | Admitting: Neurology

## 2019-08-16 ENCOUNTER — Encounter: Payer: Self-pay | Admitting: *Deleted

## 2019-08-16 NOTE — Telephone Encounter (Signed)
Pt did not show for new pt appt today.

## 2019-08-17 ENCOUNTER — Encounter: Payer: Self-pay | Admitting: Neurology

## 2019-09-12 ENCOUNTER — Ambulatory Visit
Admission: EM | Admit: 2019-09-12 | Discharge: 2019-09-12 | Disposition: A | Discharge status: Home / Self Care | Payer: Self-pay | Attending: Emergency Medicine | Admitting: Emergency Medicine

## 2019-09-12 ENCOUNTER — Encounter: Payer: Self-pay | Admitting: Emergency Medicine

## 2019-09-12 ENCOUNTER — Other Ambulatory Visit: Payer: Self-pay

## 2019-09-12 DIAGNOSIS — H01134 Eczematous dermatitis of left upper eyelid: Secondary | ICD-10-CM

## 2019-09-12 DIAGNOSIS — T23132A Burn of first degree of multiple left fingers (nail), not including thumb, initial encounter: Secondary | ICD-10-CM

## 2019-09-12 DIAGNOSIS — H01131 Eczematous dermatitis of right upper eyelid: Secondary | ICD-10-CM

## 2019-09-12 MED ORDER — DIPHENHYDRAMINE HCL 25 MG PO TABS: 25.0000 mg | ORAL_TABLET | Freq: Four times a day (QID) | ORAL | 0 refills | Status: DC | PRN

## 2019-09-12 MED ORDER — CETIRIZINE HCL 10 MG PO TABS: 10.0000 mg | ORAL_TABLET | Freq: Every day | ORAL | 0 refills | Status: DC

## 2019-09-12 MED ORDER — SILVER SULFADIAZINE 1 % EX CREA: TOPICAL_CREAM | Freq: Every day | CUTANEOUS | Status: DC

## 2019-09-12 MED ORDER — TRIAMCINOLONE ACETONIDE 0.5 % EX OINT: 1.0000 "application " | TOPICAL_OINTMENT | Freq: Two times a day (BID) | CUTANEOUS | 0 refills | Status: DC

## 2019-09-12 MED ORDER — POLYMYXIN B-TRIMETHOPRIM 10000-0.1 UNIT/ML-% OP SOLN: 1.0000 [drp] | Freq: Four times a day (QID) | OPHTHALMIC | 0 refills | Status: DC
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
## 2019-09-12 NOTE — Discharge Instructions (Signed)
Important to wash hands before using eyedrops or applying cream to eyes. Do not let dropper hit your eyelash or eye fall as this can contaminate it. Use 1 drop in each eye 4 times a day x1 week. Apply triamcinolone ointment to both eyelids twice daily x1 week. May take Benadryl at night, Zyrtec during the day. Apply Silvadene cream to burn once daily x1 week. Let air out at home, keep covered when using hands.

## 2019-10-02 ENCOUNTER — Ambulatory Visit
Admission: EM | Admit: 2019-10-02 | Discharge: 2019-10-02 | Disposition: A | Discharge status: Home / Self Care | Payer: Self-pay | Attending: Physician Assistant | Admitting: Physician Assistant

## 2019-10-02 DIAGNOSIS — R21 Rash and other nonspecific skin eruption: Secondary | ICD-10-CM

## 2019-10-02 MED ORDER — TRIAMCINOLONE ACETONIDE 0.1 % EX CREA
1.0000 | TOPICAL_CREAM | Freq: Two times a day (BID) | CUTANEOUS | 0 refills | Status: DC
Start: 2019-10-02 — End: 2019-10-07
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
## 2019-10-02 NOTE — Discharge Instructions (Signed)
Apply triamcinolone cream twice a day to affected area. Triamcinolone ointment (same one for your eyelid) at night. Avoid irritation. Monitor for spreading redness, warmth, fever, follow up for reevaluation

## 2019-10-07 ENCOUNTER — Telehealth: Payer: Self-pay

## 2019-10-07 MED ORDER — TRIAMCINOLONE ACETONIDE 0.5 % EX OINT: 1.0000 "application " | TOPICAL_OINTMENT | Freq: Two times a day (BID) | CUTANEOUS | 0 refills | Status: DC

## 2019-10-07 MED ORDER — TRIAMCINOLONE ACETONIDE 0.1 % EX CREA: 1.0000 "application " | TOPICAL_CREAM | Freq: Two times a day (BID) | CUTANEOUS | 0 refills | Status: DC

## 2019-10-07 NOTE — Telephone Encounter (Signed)
Patient called to request a refill for triamcinolone cream/ointment.  She states rash/burn is getting better. Request was reviewed with ACP who agrees to refill and advises that patient should follow up with her PCP if no better. Patient was made aware of refills sent to pharmacy and verbalizes understanding to follow up.    LMoore, RN

## 2019-10-31 ENCOUNTER — Ambulatory Visit: Payer: Self-pay | Attending: Family Medicine | Admitting: Family Medicine

## 2019-10-31 ENCOUNTER — Other Ambulatory Visit: Payer: Self-pay

## 2020-02-05 ENCOUNTER — Ambulatory Visit
Admission: EM | Admit: 2020-02-05 | Discharge: 2020-02-05 | Discharge status: Absent without leave | Payer: No Typology Code available for payment source

## 2020-02-06 ENCOUNTER — Ambulatory Visit (INDEPENDENT_AMBULATORY_CARE_PROVIDER_SITE_OTHER): Payer: Self-pay

## 2020-02-06 ENCOUNTER — Ambulatory Visit: Payer: No Typology Code available for payment source

## 2020-02-06 ENCOUNTER — Ambulatory Visit
Admission: EM | Admit: 2020-02-06 | Discharge: 2020-02-06 | Disposition: A | Discharge status: Home / Self Care | Payer: No Typology Code available for payment source | Attending: Family Medicine | Admitting: Family Medicine

## 2020-02-06 DIAGNOSIS — M25512 Pain in left shoulder: Secondary | ICD-10-CM

## 2020-02-06 DIAGNOSIS — M545 Low back pain, unspecified: Secondary | ICD-10-CM

## 2020-02-06 DIAGNOSIS — M25531 Pain in right wrist: Secondary | ICD-10-CM

## 2020-02-06 DIAGNOSIS — M546 Pain in thoracic spine: Secondary | ICD-10-CM

## 2020-02-06 DIAGNOSIS — M25562 Pain in left knee: Secondary | ICD-10-CM

## 2020-02-06 MED ORDER — NAPROXEN 500 MG PO TABS: 500.0000 mg | ORAL_TABLET | Freq: Two times a day (BID) | ORAL | 0 refills | Status: DC | PRN

## 2020-02-06 NOTE — Discharge Instructions (Signed)
Xray was negative for fracture. Naproxen as needed. Ice compress to swelling. This may take a few weeks to resolve, but should be getting better each week. Follow up with PCP/orthopedics if symptoms not improving.
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
## 2020-07-02 ENCOUNTER — Ambulatory Visit
Admission: EM | Admit: 2020-07-02 | Discharge: 2020-07-02 | Disposition: A | Discharge status: Home / Self Care | Payer: Medicaid Other | Attending: Family Medicine | Admitting: Family Medicine

## 2020-07-02 ENCOUNTER — Other Ambulatory Visit: Payer: Self-pay

## 2020-07-02 ENCOUNTER — Encounter: Payer: Self-pay | Admitting: Emergency Medicine

## 2020-07-02 DIAGNOSIS — L2089 Other atopic dermatitis: Secondary | ICD-10-CM

## 2020-07-02 MED ORDER — DEXAMETHASONE SODIUM PHOSPHATE 10 MG/ML IJ SOLN
10.0000 mg | Freq: Once | INTRAMUSCULAR | Status: AC
  Administered 2020-07-02: 10 mg via INTRAMUSCULAR

## 2020-07-02 MED ORDER — TRIAMCINOLONE ACETONIDE 0.025 % EX OINT: 1.0000 "application " | TOPICAL_OINTMENT | Freq: Two times a day (BID) | CUTANEOUS | 0 refills | Status: DC

## 2020-07-02 NOTE — ED Triage Notes (Signed)
Patient c/o rash x 2 days.   Patient states rash is present on both upper extremities.   Patient endorses the rash has been reoccurring and first started about 6 months ago.   Patient states she was seen at this clinic and received a steroid cream that helps with symptoms. Patient has "ran out of the cream" for the rash.   Patient endorses the rash is itchy.

## 2020-07-02 NOTE — ED Provider Notes (Signed)
[name]    CSN: [id number] Arrival date & time: 07/02/20  0936      History   Chief Complaint Chief Complaint  Patient presents with  . Rash    HPI Lori Perkins is a 50 y.o. female.   HPI  Patient presents today with recurrent rash involving bilateral hands now in the flexural surfaces of her upper extremities.  The rash is erythematous persistently itchy and presents in a flat macular type pattern.  Patient denies any knowledge of being diagnosed with eczema however over the last year she has had recurrent episodes of such forms of a rash.  She denies any recent chemical exposures or any persistent exposure to water.  She endorses that her skin is dry.  She had previously been prescribed triamcinolone cream which helped significantly with the itching and rash previously however she ran out of the medication.  Denies any other rash or concerns today. Past Medical History:  Diagnosis Date  . GERD (gastroesophageal reflux disease)   . History of abnormal cervical Pap smear 2013   2013 Pap smear LSIL+, HPV not detected, 2014 Colposcopy with benign tissue   . History of sexually transmitted disease 01/16/2014   BV, trichomonas    Patient Active Problem List   Diagnosis Date Noted  . Fibroids 06/22/2019  . Well woman exam with routine gynecological exam 06/01/2019  . Breast pain 06/01/2019  . Menorrhagia with regular cycle 05/01/2019  . History of sexually transmitted disease 05/17/2015  . History of abnormal cervical Pap smear 05/17/2015  . Hemorrhoids 05/17/2015  . Cold intolerance 05/17/2015  . Preventative health care 05/17/2015  . Tobacco use disorder 05/17/2015    Past Surgical History:  Procedure Laterality Date  . MULTIPLE TOOTH EXTRACTIONS      OB History    Gravida  0   Para  0   Term  0   Preterm  0   AB  0   Living  0     SAB  0   IAB  0   Ectopic  0   Multiple  0   Live Births  0            Home  Medications    Prior to Admission medications   Medication Sig Start Date End Date Taking? Authorizing Provider  acetaminophen (TYLENOL) 500 MG tablet Take 1,000 mg by mouth every 6 (six) hours as needed for mild pain.     [provider]  naproxen (NAPROSYN) 500 MG tablet Take 1 tablet (500 mg total) by mouth 2 (two) times daily as needed for mild pain. 02/06/20   Tasia Catchings, Amy V, PA-C  diphenhydrAMINE (BENADRYL) 25 MG tablet Take 1 tablet (25 mg total) by mouth every 6 (six) hours as needed. 09/12/19 02/06/20  Hall-Potvin, [place], PA-C    Family History Family History  Problem Relation Age of Onset  . Diabetes Mother   . Hypertension Father   . Stroke Father   . Cancer Maternal Aunt   . Breast cancer Maternal Aunt 71  . Cancer Maternal Uncle   . Diabetes Maternal Grandmother   . Breast cancer Maternal Grandmother 80    Social History Social History   Tobacco Use  . Smoking status: Current Every Day Smoker    Packs/day: 0.50    Years: 20.00    Pack years: 10.00    Types: Cigarettes  . Smokeless tobacco: Never Used  Vaping Use  . Vaping Use: Some days  Substance  Use Topics  . Alcohol use: Yes    Comment: occasionally  . Drug use: Not Currently    Types: Marijuana     Allergies   Penicillins   Review of Systems Review of Systems Pertinent negatives listed in HPI   Physical Exam Triage Vital Signs ED Triage Vitals  Enc Vitals Group     BP 07/02/20 1028 (!) 149/72     Pulse Rate 07/02/20 1028 80     Resp 07/02/20 1028 16     Temp 07/02/20 1028 98.6 F (37 C)     Temp Source 07/02/20 1028 Oral     SpO2 07/02/20 1028 98 %     Weight --      Height --      Head Circumference --      Peak Flow --      Pain Score 07/02/20 1026 0     Pain Loc --      Pain Edu? --      Excl. in [place]? --    No data found.  Updated Vital Signs BP (!) 149/72 (BP Location: Right Arm)   Pulse 80   Temp 98.6 F (37 C) (Oral)   Resp 16   LMP 07/02/2020   SpO2 98%    Visual Acuity Right Eye Distance:   Left Eye Distance:   Bilateral Distance:    Right Eye Near:   Left Eye Near:    Bilateral Near:     Physical Exam General appearance: alert, well developed, well nourished, cooperative and in no distress Head: Normocephalic, without obvious abnormality, atraumatic Respiratory: Respirations even and unlabored, normal respiratory rate Heart: Rate and rhythm normal. No gallop or murmurs noted on exam  Skin: Right and Left  Dorsum -hand and right and left brachial region of forearms : Scaly, macular with fine papular erythematous rash-without drainage or exudate Psych: Appropriate mood and affect.  UC Treatments / Results  Labs (all labs ordered are listed, but only abnormal results are displayed) Labs Reviewed - No data to display  EKG   Radiology No results found.  Procedures Procedures (including critical care time)  Medications Ordered in UC Medications - No data to display  Initial Impression / Assessment and Plan / UC Course  I have reviewed the triage vital signs and the nursing notes.  Pertinent labs & imaging results that were available during my care of the patient were reviewed by me and considered in my medical decision making (see chart for details).    Patient presents with recurrent skin rash which appearance is consistent with that of an atopic dermatitis.  Patient given an IM injection of Decadron 10 mg here in clinic today.  Discharged home with triamcinolone cream to use twice daily until rash resolves.  Information given for patient to follow-up with dermatology if rash continues to recur.  Final Clinical Impressions(s) / UC Diagnoses   Final diagnoses:  Flexural atopic dermatitis   Discharge Instructions   None    ED Prescriptions    Medication Sig Dispense Auth. Provider   triamcinolone (KENALOG) 0.025 % ointment Apply 1 application topically 2 (two) times daily. 454 g Scot Jun, FNP     PDMP  not reviewed this encounter.   Scot Jun, FNP 07/02/20 [phone]

## 2020-07-18 ENCOUNTER — Encounter: Payer: Self-pay | Admitting: *Deleted

## 2020-07-18 ENCOUNTER — Ambulatory Visit
Admission: EM | Admit: 2020-07-18 | Discharge: 2020-07-18 | Disposition: A | Discharge status: Home / Self Care | Payer: Medicaid Other | Attending: Emergency Medicine | Admitting: Emergency Medicine

## 2020-07-18 ENCOUNTER — Other Ambulatory Visit: Payer: Self-pay

## 2020-07-18 DIAGNOSIS — H1013 Acute atopic conjunctivitis, bilateral: Secondary | ICD-10-CM

## 2020-07-18 DIAGNOSIS — L2089 Other atopic dermatitis: Secondary | ICD-10-CM

## 2020-07-18 DIAGNOSIS — R21 Rash and other nonspecific skin eruption: Secondary | ICD-10-CM

## 2020-07-18 MED ORDER — HYDROXYZINE HCL 25 MG PO TABS
25.0000 mg | ORAL_TABLET | Freq: Four times a day (QID) | ORAL | 0 refills | Status: DC
Start: 2020-07-18 — End: 2021-05-23

## 2020-07-18 MED ORDER — BETAMETHASONE DIPROPIONATE 0.05 % EX OINT
TOPICAL_OINTMENT | Freq: Two times a day (BID) | CUTANEOUS | 0 refills | Status: DC
Start: 2020-07-18 — End: 2021-08-13

## 2020-07-18 MED ORDER — OLOPATADINE HCL 0.1 % OP SOLN: 1.0000 [drp] | Freq: Two times a day (BID) | OPHTHALMIC | 12 refills | Status: DC

## 2020-07-18 NOTE — ED Triage Notes (Signed)
Patient presents c/o continued rash since being seen 02/22, concerned it may be infected.   Rash present on hands, arms, face, back and neck.  Reports rash is painful and tender to touch.  No redness or edema noted.   Has been using cream with some improvement.  Has not been able to get in with dermatology or PCP yet for follow up.

## 2020-07-18 NOTE — Discharge Instructions (Signed)
Please use olopatadine eyedrops twice daily to help with eye itching and burning Begin daily antihistamine-cetirizine/Zyrtec or loratadine/Claritin or Allegra over-the-counter May supplement with hydroxyzine as needed for itching at home, may cause drowsiness May try betamethasone ointment to hands as alternative to triamcinolone Please also use moisturizing cream such as Eucerin, CeraVe to further lock in moisture  Return if symptoms not improving or worsening

## 2020-07-18 NOTE — ED Provider Notes (Signed)
[name]    CSN: [id number] Arrival date & time: 07/18/20  6712      History   Chief Complaint Chief Complaint  Patient presents with  . Rash    HPI Lori Perkins is a 50 y.o. female presenting today for follow-up of a rash.  Patient was seen on 2/22 and treated for eczema with triamcinolone ointment topically.  Reports that rash has significantly improved, but reports has slightly changed over the past couple days.  Reports areas occasionally itchy, occasionally tender.  Also reports bilateral eye itching and burning.  Denies change in vision.  Denies injury or trauma.  HPI  Past Medical History:  Diagnosis Date  . GERD (gastroesophageal reflux disease)   . History of abnormal cervical Pap smear 2013   2013 Pap smear LSIL+, HPV not detected, 2014 Colposcopy with benign tissue   . History of sexually transmitted disease 01/16/2014   BV, trichomonas    Patient Active Problem List   Diagnosis Date Noted  . Fibroids 06/22/2019  . Well woman exam with routine gynecological exam 06/01/2019  . Breast pain 06/01/2019  . Menorrhagia with regular cycle 05/01/2019  . History of sexually transmitted disease 05/17/2015  . History of abnormal cervical Pap smear 05/17/2015  . Hemorrhoids 05/17/2015  . Cold intolerance 05/17/2015  . Preventative health care 05/17/2015  . Tobacco use disorder 05/17/2015    Past Surgical History:  Procedure Laterality Date  . MULTIPLE TOOTH EXTRACTIONS      OB History    Gravida  0   Para  0   Term  0   Preterm  0   AB  0   Living  0     SAB  0   IAB  0   Ectopic  0   Multiple  0   Live Births  0            Home Medications    Prior to Admission medications   Medication Sig Start Date End Date Taking? Authorizing Provider  betamethasone dipropionate (DIPROLENE) 0.05 % ointment Apply topically 2 (two) times daily. 07/18/20  Yes Dodd Schmid C, PA-C  hydrOXYzine (ATARAX/VISTARIL) 25 MG tablet  Take 1 tablet (25 mg total) by mouth every 6 (six) hours. 07/18/20  Yes Anjanette Gilkey C, PA-C  olopatadine (PATANOL) 0.1 % ophthalmic solution Place 1 drop into both eyes 2 (two) times daily. 07/18/20  Yes Sakari Alkhatib C, PA-C  acetaminophen (TYLENOL) 500 MG tablet Take 1,000 mg by mouth every 6 (six) hours as needed for mild pain.     [provider]  naproxen (NAPROSYN) 500 MG tablet Take 1 tablet (500 mg total) by mouth 2 (two) times daily as needed for mild pain. 02/06/20   Tasia Catchings, Amy V, PA-C  triamcinolone (KENALOG) 0.025 % ointment Apply 1 application topically 2 (two) times daily. 07/02/20   Scot Jun, FNP  diphenhydrAMINE (BENADRYL) 25 MG tablet Take 1 tablet (25 mg total) by mouth every 6 (six) hours as needed. 09/12/19 02/06/20  Hall-Potvin, [place], PA-C    Family History Family History  Problem Relation Age of Onset  . Diabetes Mother   . Hypertension Father   . Stroke Father   . Cancer Maternal Aunt   . Breast cancer Maternal Aunt 32  . Cancer Maternal Uncle   . Diabetes Maternal Grandmother   . Breast cancer Maternal Grandmother 80    Social History Social History   Tobacco Use  . Smoking status: Current Every  Day Smoker    Packs/day: 0.50    Years: 20.00    Pack years: 10.00    Types: Cigarettes  . Smokeless tobacco: Never Used  Vaping Use  . Vaping Use: Some days  Substance Use Topics  . Alcohol use: Yes    Comment: occasionally  . Drug use: Not Currently    Types: Marijuana     Allergies   Penicillins   Review of Systems Review of Systems  Constitutional: Negative for fatigue and fever.  HENT: Negative for mouth sores.   Eyes: Positive for itching. Negative for redness and visual disturbance.  Respiratory: Negative for shortness of breath.   Cardiovascular: Negative for chest pain.  Gastrointestinal: Negative for abdominal pain, nausea and vomiting.  Genitourinary: Negative for genital sores.  Musculoskeletal: Negative for  arthralgias and joint swelling.  Skin: Positive for color change and rash. Negative for wound.  Neurological: Negative for dizziness, weakness, light-headedness and headaches.     Physical Exam Triage Vital Signs ED Triage Vitals  Enc Vitals Group     BP 07/18/20 0957 (!) 156/98     Pulse Rate 07/18/20 0957 (!) 106     Resp 07/18/20 0957 16     Temp 07/18/20 0957 99.1 F (37.3 C)     Temp Source 07/18/20 0957 Oral     SpO2 07/18/20 0957 99 %     Weight --      Height --      Head Circumference --      Peak Flow --      Pain Score 07/18/20 0959 10     Pain Loc --      Pain Edu? --      Excl. in [place]? --    No data found.  Updated Vital Signs BP (!) 156/98 (BP Location: Right Arm)   Pulse (!) 106   Temp 99.1 F (37.3 C) (Oral)   Resp 16   LMP 07/02/2020   SpO2 99%   Visual Acuity Right Eye Distance:   Left Eye Distance:   Bilateral Distance:    Right Eye Near:   Left Eye Near:    Bilateral Near:     Physical Exam Vitals and nursing note reviewed.  Constitutional:      Appearance: She is well-developed.     Comments: No acute distress  HENT:     Head: Normocephalic and atraumatic.     Nose: Nose normal.  Eyes:     Extraocular Movements: Extraocular movements intact.     Conjunctiva/sclera: Conjunctivae normal.     Pupils: Pupils are equal, round, and reactive to light.     Comments: Bilateral eyes without erythema, anterior chamber clear, no photophobia, no discharge noted  Cardiovascular:     Rate and Rhythm: Normal rate.  Pulmonary:     Effort: Pulmonary effort is normal. No respiratory distress.  Abdominal:     General: There is no distension.  Musculoskeletal:        General: Normal range of motion.     Cervical back: Neck supple.  Skin:    General: Skin is warm and dry.     Comments: Bilateral hands with hyperpigmentation and dryness noted to dorsum of hand, slight erythema noted to thenar eminence on palmar aspect bilaterally, no open areas noted  without significant erythema  Left antecubital area with small hyperpigmented area  Neurological:     Mental Status: She is alert and oriented to person, place, and time.  UC Treatments / Results  Labs (all labs ordered are listed, but only abnormal results are displayed) Labs Reviewed - No data to display  EKG   Radiology No results found.  Procedures Procedures (including critical care time)  Medications Ordered in UC Medications - No data to display  Initial Impression / Assessment and Plan / UC Course  I have reviewed the triage vital signs and the nursing notes.  Pertinent labs & imaging results that were available during my care of the patient were reviewed by me and considered in my medical decision making (see chart for details).     Rash consistent with eczema, no signs of secondary infections or cellulitis at this time.  May continue triamcinolone, will provide betamethasone as alternative.  Hydroxyzine to help with itching as needed.  Encouraged moisturization measures as well to further help with eczema.  Suspect eye itching and irritation likely allergic-recommended daily antihistamine and olopatadine topically; no change in visual acuity  Discussed strict return precautions. Patient verbalized understanding and is agreeable with plan.  Final Clinical Impressions(s) / UC Diagnoses   Final diagnoses:  Rash and nonspecific skin eruption  Flexural atopic dermatitis  Allergic conjunctivitis of both eyes     Discharge Instructions     Please use olopatadine eyedrops twice daily to help with eye itching and burning Begin daily antihistamine-cetirizine/Zyrtec or loratadine/Claritin or Allegra over-the-counter May supplement with hydroxyzine as needed for itching at home, may cause drowsiness May try betamethasone ointment to hands as alternative to triamcinolone Please also use moisturizing cream such as Eucerin, CeraVe to further lock in moisture   Return if symptoms not improving or worsening    ED Prescriptions    Medication Sig Dispense Auth. Provider   olopatadine (PATANOL) 0.1 % ophthalmic solution Place 1 drop into both eyes 2 (two) times daily. 5 mL Alexius Ellington C, PA-C   hydrOXYzine (ATARAX/VISTARIL) 25 MG tablet Take 1 tablet (25 mg total) by mouth every 6 (six) hours. 16 tablet Square Jowett C, PA-C   betamethasone dipropionate (DIPROLENE) 0.05 % ointment Apply topically 2 (two) times daily. 45 g Nizar Cutler, [place] C, PA-C     PDMP not reviewed this encounter.   Janith Lima, PA-C 07/18/20 1036

## 2020-09-11 ENCOUNTER — Ambulatory Visit: Payer: Medicaid Other | Admitting: Family Medicine

## 2020-10-08 ENCOUNTER — Encounter: Payer: Self-pay | Admitting: Emergency Medicine

## 2020-10-08 ENCOUNTER — Ambulatory Visit
Admission: EM | Admit: 2020-10-08 | Discharge: 2020-10-08 | Disposition: A | Discharge status: Home / Self Care | Payer: Medicaid Other | Attending: Family Medicine | Admitting: Family Medicine

## 2020-10-08 ENCOUNTER — Other Ambulatory Visit: Payer: Self-pay

## 2020-10-08 DIAGNOSIS — L308 Other specified dermatitis: Secondary | ICD-10-CM

## 2020-10-08 MED ORDER — TRIAMCINOLONE ACETONIDE 0.1 % EX CREA
1.0000 | TOPICAL_CREAM | Freq: Two times a day (BID) | CUTANEOUS | 0 refills | Status: DC
Start: 2020-10-08 — End: 2021-03-11

## 2020-10-08 MED ORDER — DEXAMETHASONE SODIUM PHOSPHATE 10 MG/ML IJ SOLN
10.0000 mg | Freq: Once | INTRAMUSCULAR | Status: AC
  Administered 2020-10-08: 10 mg via INTRAMUSCULAR

## 2020-10-08 NOTE — ED Provider Notes (Signed)
[name]    CSN: [id number] Arrival date & time: 10/08/20  0818      History   Chief Complaint Chief Complaint  Patient presents with  . Rash    HPI Lori Perkins is a 50 y.o. female.   HPI  Patient with a history of recurrent dermatitis eczema presents today with a rash eruption that has been present for a week.  She endorses rashes on her arms and the palmar surfaces of her hands.  She is not currently taking any antihistamine on a daily basis.  Treatment with steroidal creams in the past will resolve the rash eruption temporarily however it recurs.  She has not seen a dermatologist.  She has a primary care provider however has not seen her PCP since 2021.  The rash is pruritic.  Past Medical History:  Diagnosis Date  . GERD (gastroesophageal reflux disease)   . History of abnormal cervical Pap smear 2013   2013 Pap smear LSIL+, HPV not detected, 2014 Colposcopy with benign tissue   . History of sexually transmitted disease 01/16/2014   BV, trichomonas    Patient Active Problem List   Diagnosis Date Noted  . Fibroids 06/22/2019  . Well woman exam with routine gynecological exam 06/01/2019  . Breast pain 06/01/2019  . Menorrhagia with regular cycle 05/01/2019  . History of sexually transmitted disease 05/17/2015  . History of abnormal cervical Pap smear 05/17/2015  . Hemorrhoids 05/17/2015  . Cold intolerance 05/17/2015  . Preventative health care 05/17/2015  . Tobacco use disorder 05/17/2015    Past Surgical History:  Procedure Laterality Date  . MULTIPLE TOOTH EXTRACTIONS      OB History    Gravida  0   Para  0   Term  0   Preterm  0   AB  0   Living  0     SAB  0   IAB  0   Ectopic  0   Multiple  0   Live Births  0            Home Medications    Prior to Admission medications   Medication Sig Start Date End Date Taking? Authorizing Provider  acetaminophen (TYLENOL) 500 MG tablet Take 1,000 mg by mouth every  6 (six) hours as needed for mild pain.     [provider]  betamethasone dipropionate (DIPROLENE) 0.05 % ointment Apply topically 2 (two) times daily. Patient not taking: Reported on 10/08/2020 07/18/20   Wieters, Hallie C, PA-C  hydrOXYzine (ATARAX/VISTARIL) 25 MG tablet Take 1 tablet (25 mg total) by mouth every 6 (six) hours. Patient not taking: Reported on 10/08/2020 07/18/20   Wieters, Hallie C, PA-C  naproxen (NAPROSYN) 500 MG tablet Take 1 tablet (500 mg total) by mouth 2 (two) times daily as needed for mild pain. Patient not taking: Reported on 10/08/2020 02/06/20   Ok Edwards, PA-C  olopatadine (PATANOL) 0.1 % ophthalmic solution Place 1 drop into both eyes 2 (two) times daily. Patient not taking: Reported on 10/08/2020 07/18/20   Wieters, Hallie C, PA-C  triamcinolone (KENALOG) 0.025 % ointment Apply 1 application topically 2 (two) times daily. Patient not taking: Reported on 10/08/2020 07/02/20   Scot Jun, FNP  diphenhydrAMINE (BENADRYL) 25 MG tablet Take 1 tablet (25 mg total) by mouth every 6 (six) hours as needed. 09/12/19 02/06/20  Hall-Potvin, [place], PA-C    Family History Family History  Problem Relation Age of Onset  . Diabetes Mother   .  Hypertension Father   . Stroke Father   . Cancer Maternal Aunt   . Breast cancer Maternal Aunt 40  . Cancer Maternal Uncle   . Diabetes Maternal Grandmother   . Breast cancer Maternal Grandmother 80    Social History Social History   Tobacco Use  . Smoking status: Current Every Day Smoker    Packs/day: 0.50    Years: 20.00    Pack years: 10.00    Types: Cigarettes  . Smokeless tobacco: Never Used  Vaping Use  . Vaping Use: Former  Substance Use Topics  . Alcohol use: Yes    Comment: occasionally  . Drug use: Not Currently    Types: Marijuana     Allergies   Penicillins   Review of Systems Review of Systems Pertinent negatives listed in HPI   Physical Exam Triage Vital Signs ED Triage Vitals  Enc  Vitals Group     BP 10/08/20 0831 125/77     Pulse Rate 10/08/20 0831 80     Resp 10/08/20 0831 18     Temp 10/08/20 0831 98.1 F (36.7 C)     Temp Source 10/08/20 0831 Oral     SpO2 10/08/20 0831 99 %     Weight --      Height --      Head Circumference --      Peak Flow --      Pain Score 10/08/20 0845 10     Pain Loc --      Pain Edu? --      Excl. in [place]? --    No data found.  Updated Vital Signs BP 125/77 (BP Location: Left Arm)   Pulse 80   Temp 98.1 F (36.7 C) (Oral)   Resp 18   LMP 09/23/2020   SpO2 99%   Visual Acuity Right Eye Distance:   Left Eye Distance:   Bilateral Distance:    Right Eye Near:   Left Eye Near:    Bilateral Near:     Physical Exam General appearance: Alert, well developed, well nourished, cooperative Head: Normocephalic, without obvious abnormality, atraumatic Respiratory: Respirations even and unlabored, normal respiratory rate Heart: Rate and rhythm normal. No gallop or murmurs noted on exam  Extremities: No gross deformities Skin: Macular rash palmar surface of hand and bilateral upper extremities  UC Treatments / Results  Labs (all labs ordered are listed, but only abnormal results are displayed) Labs Reviewed - No data to display  EKG   Radiology No results found.  Procedures Procedures (including critical care time)  Medications Ordered in UC Medications - No data to display  Initial Impression / Assessment and Plan / UC Course  I have reviewed the triage vital signs and the nursing notes.  Pertinent labs & imaging results that were available during my care of the patient were reviewed by me and considered in my medical decision making (see chart for details).     Eczema, Decadron 10 IM given here in clinic.  Patient will start antihistamine purchase over-the-counter.  Also prescribed triamcinolone cream she will start applying twice daily as needed.  Follow-up with [facility] dermatology.  Patient was advised that  this is not a curative condition and will remain recurrent.  However dermatology may be able to assist her and provide another specific and manageable regimen.  Patient verbalized understanding agreement plan.  Final Clinical Impressions(s) / UC Diagnoses   Final diagnoses:  Other eczema     Discharge Instructions  Use triamcinolone cream twice daily as needed for skin rash eruptions.  Start an over-the-counter antihistamine for example cetirizine, Allegra, Claritin, you can purchase either 1 of these which are once the least expensive they are both have the same effect however you will need to take daily. Schedule follow-up with [facility] dermatology for further evaluation and management of recurrent eczema.     ED Prescriptions    Medication Sig Dispense Auth. Provider   triamcinolone cream (KENALOG) 0.1 % Apply 1 application topically 2 (two) times daily. 454 g Scot Jun, FNP     PDMP not reviewed this encounter.   Scot Jun, [place] 10/08/20 [phone]

## 2020-10-08 NOTE — ED Triage Notes (Signed)
Itchy rash, reports history of the same.  Points to rash and swelling of hands

## 2020-10-08 NOTE — Discharge Instructions (Addendum)
Use triamcinolone cream twice daily as needed for skin rash eruptions.  Start an over-the-counter antihistamine for example cetirizine, Allegra, Claritin, you can purchase either 1 of these which are once the least expensive they are both have the same effect however you will need to take daily. Schedule follow-up with [facility] dermatology for further evaluation and management of recurrent eczema.

## 2020-12-03 IMAGING — US US PELVIS COMPLETE
1 series · 13 of 25 positions shown · non-contrast
Comparison: 03/01/2012

CLINICAL DATA: Bleeding after trauma.

EXAM:
TRANSABDOMINAL AND TRANSVAGINAL ULTRASOUND OF PELVIS
TECHNIQUE: Both transabdominal and transvaginal ultrasound examinations of the
pelvis were performed. Transabdominal technique was performed for
global imaging of the pelvis including uterus, ovaries, adnexal
regions, and pelvic cul-de-sac. It was necessary to proceed with
endovaginal exam following the transabdominal exam to visualize the
endometrium and bilateral ovaries.

[Series 1: us pelvis complete · 106 acquisitions, 13 frames shown]
[im 1/106]
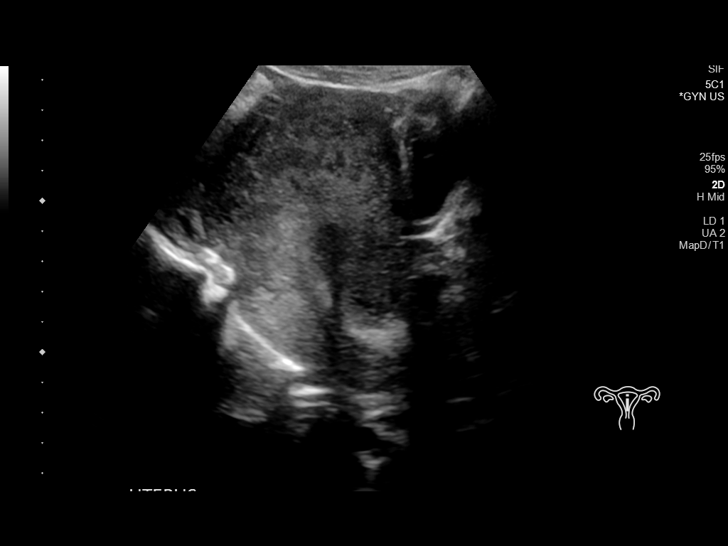
[im 9/106]
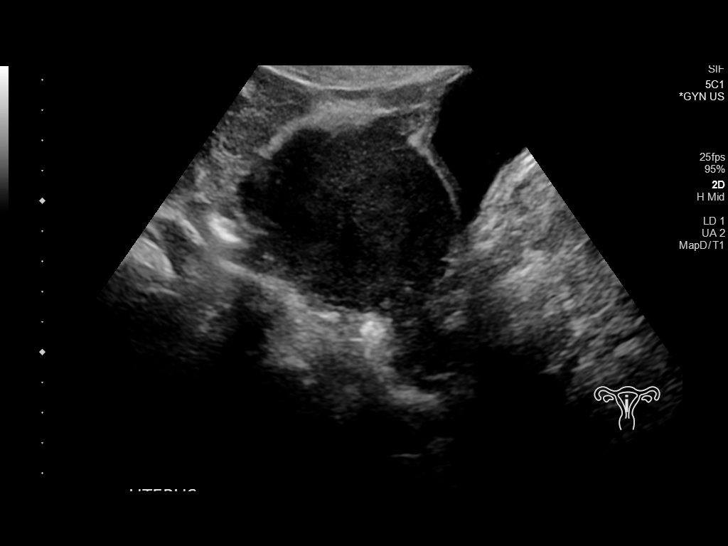
[im 18/106]
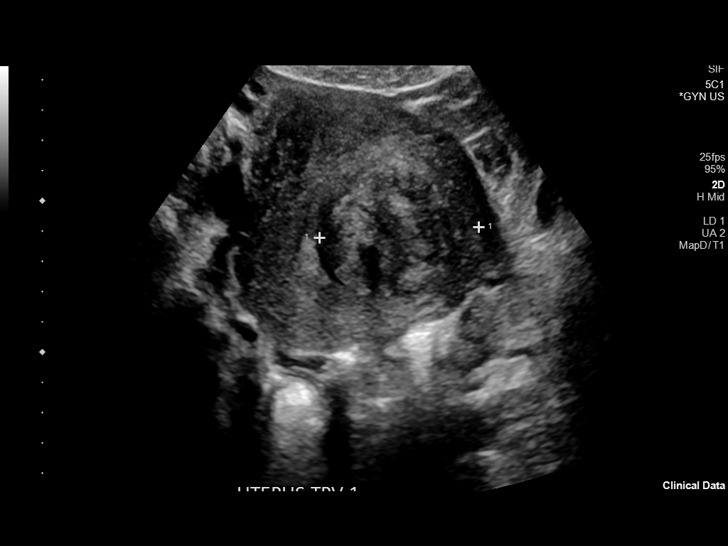
[im 27/106]
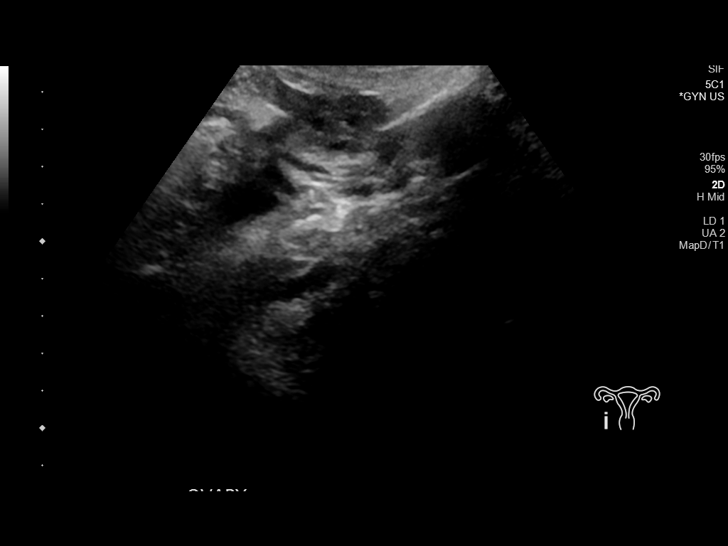
[im 36/106]
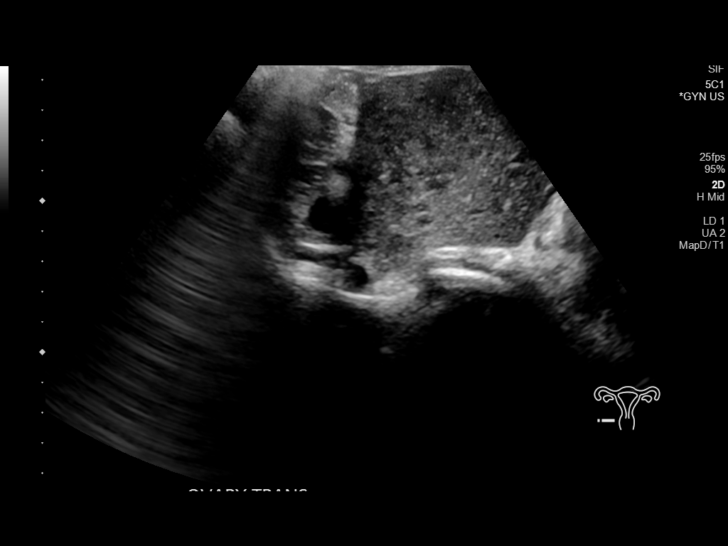
[im 44/106]
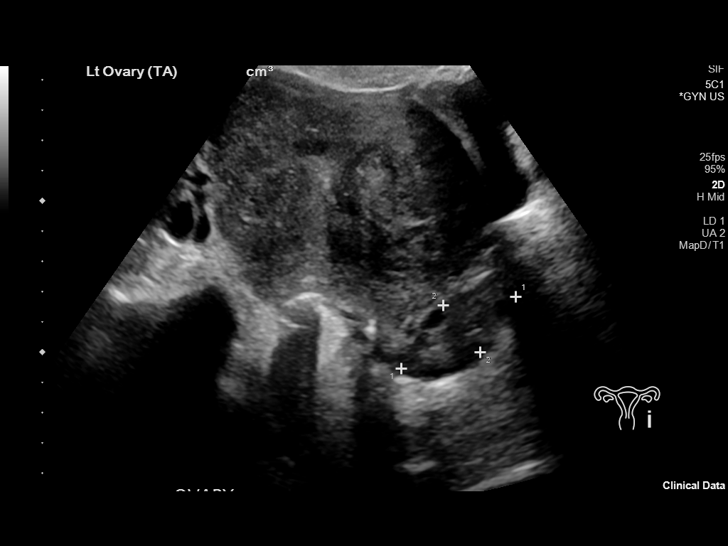
[im 53/106]
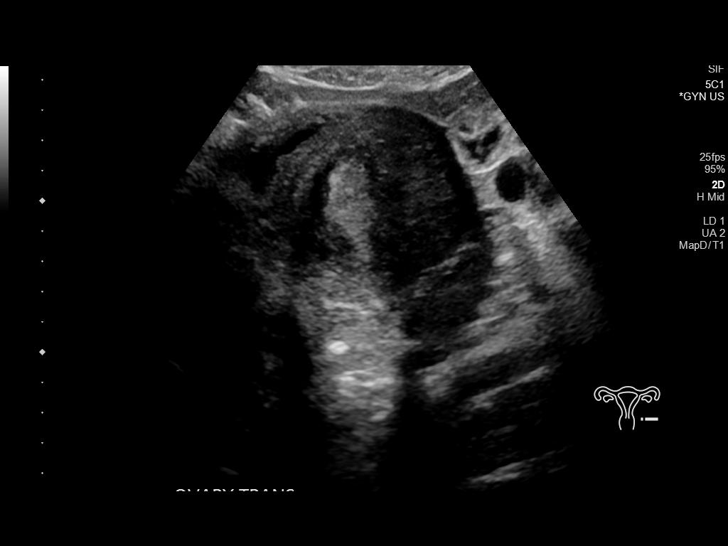
[im 62/106]
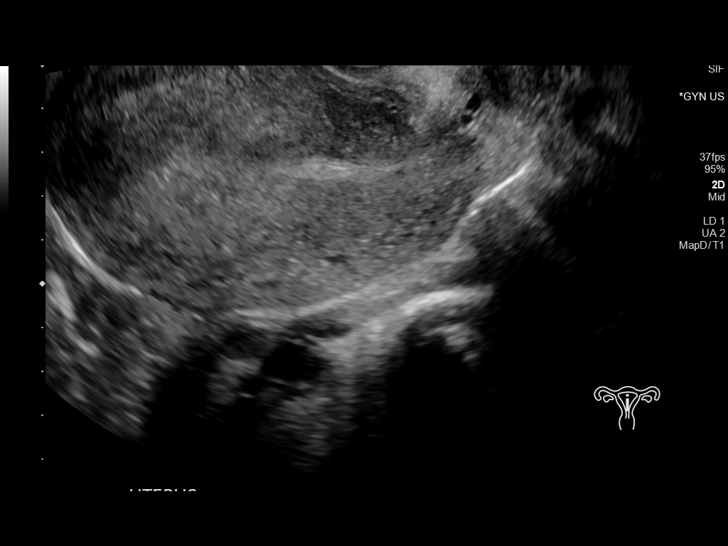
[im 71/106]
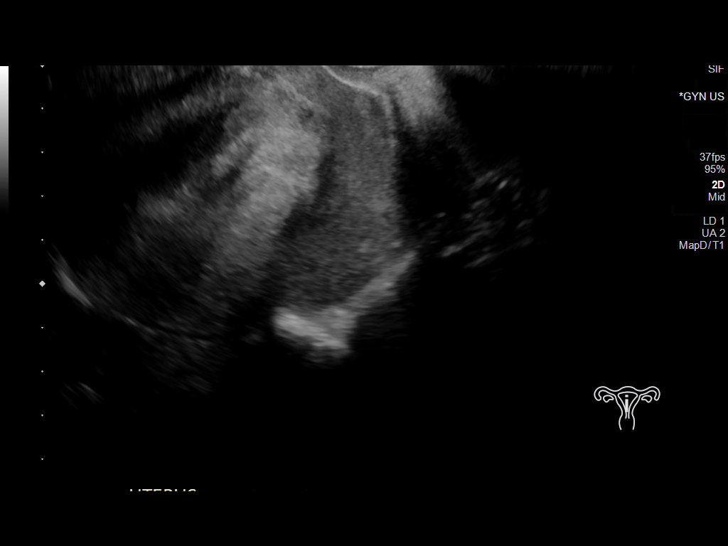
[im 79/106]
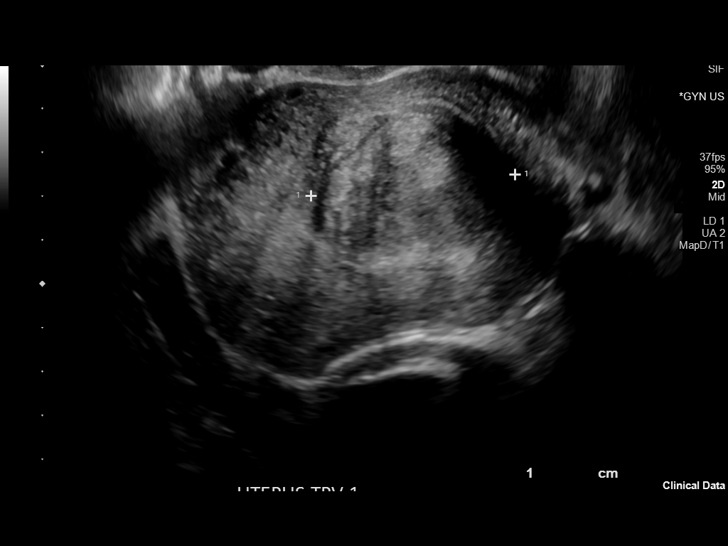
[im 88/106]
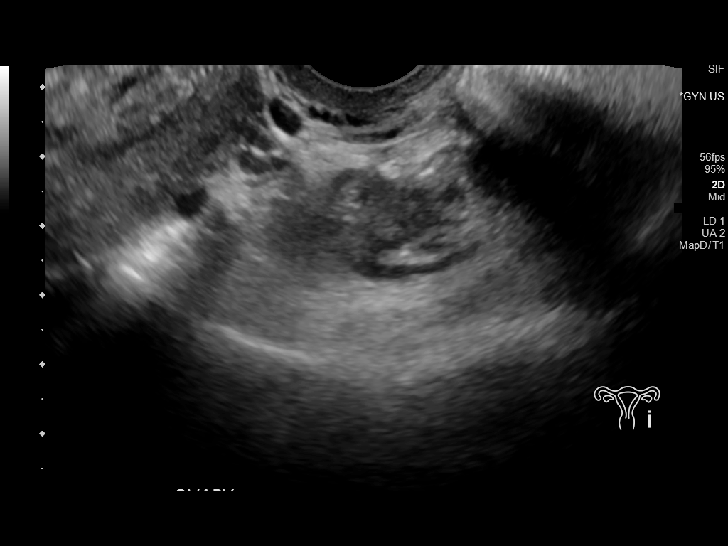
[im 97/106]
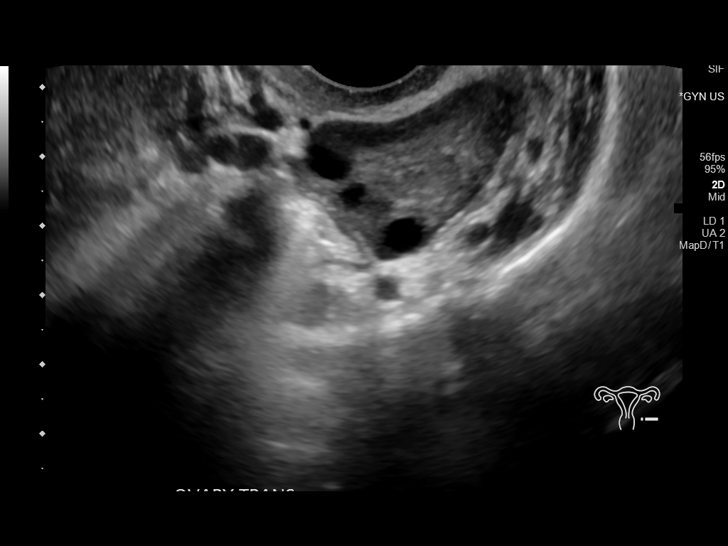
[im 106/106]
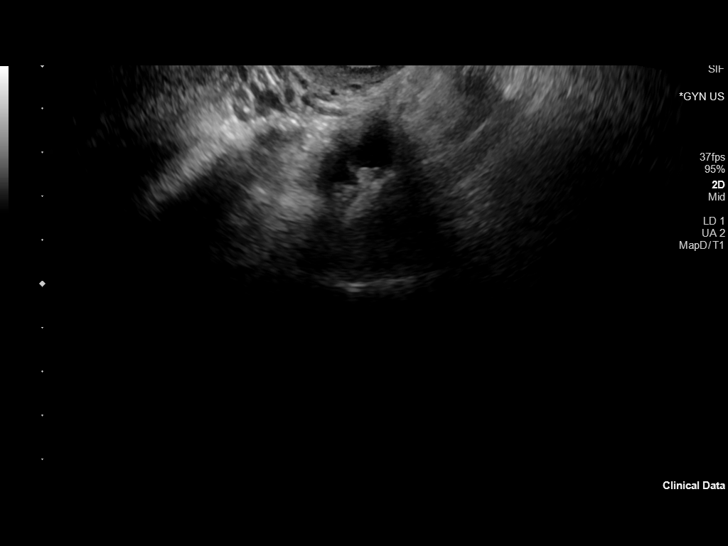

[13 of 25 positions shown; findings below may reference images not displayed]

FINDINGS: Uterus

Measurements: 11.4 x 6.5 x 0.3 cm = volume: 324 mL. Mass in the left
anterior uterus near the fundus measuring 5.2 x 4.1 x 4.7 cm. There
is evidence of posterior acoustic shadowing and there are areas of
increased echogenicity.

Endometrium

Thickness: 9 mm. The masslike area that shows posterior acoustic
shadowing is inseparable from the endometrium along its left lateral
margin.

Right ovary

Measurements: 3.2 x 1.7 x 2.6 cm = volume: 7.2 mL. Normal
appearance/no adnexal mass.

Left ovary

Measurements: 4.5 x 2.0 x 2.1 cm = volume: 9.8 mL. Normal
appearance/no adnexal mass.

Other findings

No abnormal free fluid.
IMPRESSION: 1. Large submucosal fibroid in the anterior left uterus.
2. Normal appearance of the ovaries.
3. No free fluid.

## 2020-12-03 IMAGING — US US TRANSVAGINAL NON-OB
1 series · 13 of 25 positions shown · non-contrast
Comparison: 03/01/2012

CLINICAL DATA: Bleeding after trauma.

EXAM:
TRANSABDOMINAL AND TRANSVAGINAL ULTRASOUND OF PELVIS
TECHNIQUE: Both transabdominal and transvaginal ultrasound examinations of the
pelvis were performed. Transabdominal technique was performed for
global imaging of the pelvis including uterus, ovaries, adnexal
regions, and pelvic cul-de-sac. It was necessary to proceed with
endovaginal exam following the transabdominal exam to visualize the
endometrium and bilateral ovaries.

[Series 1: us transvaginal non-ob · 106 acquisitions, 13 frames shown]
[im 1/106]
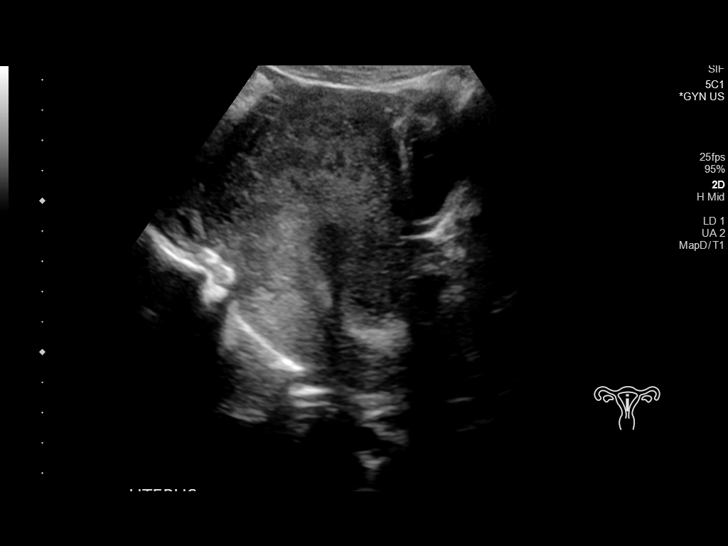
[im 9/106]
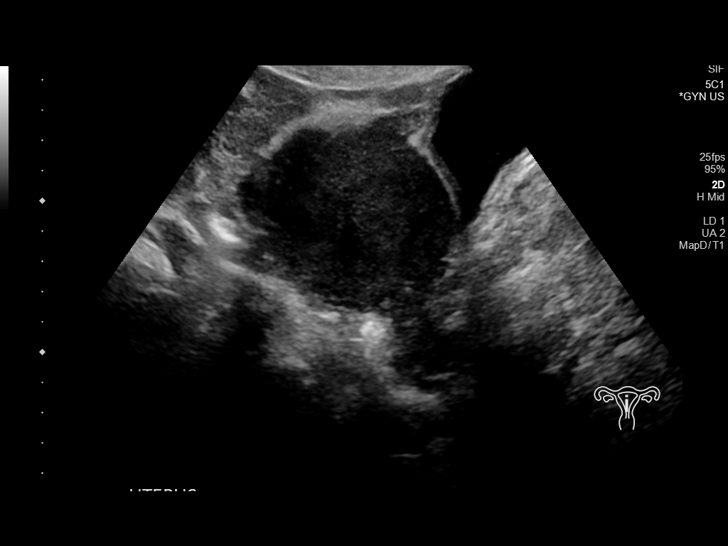
[im 18/106]
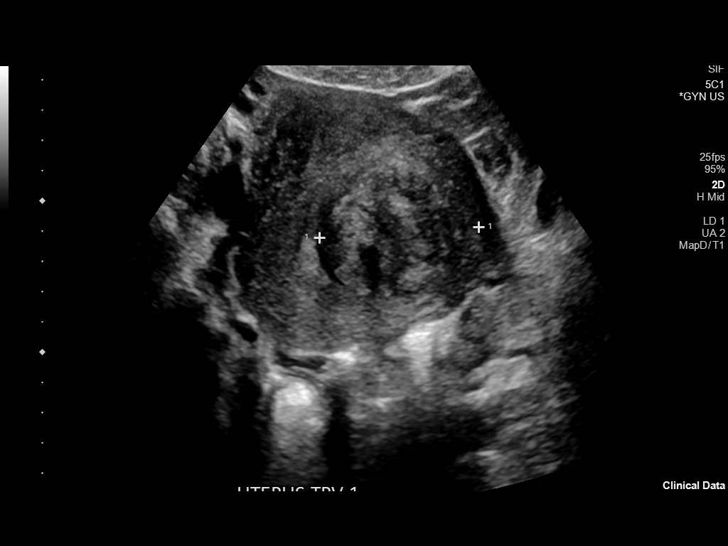
[im 27/106]
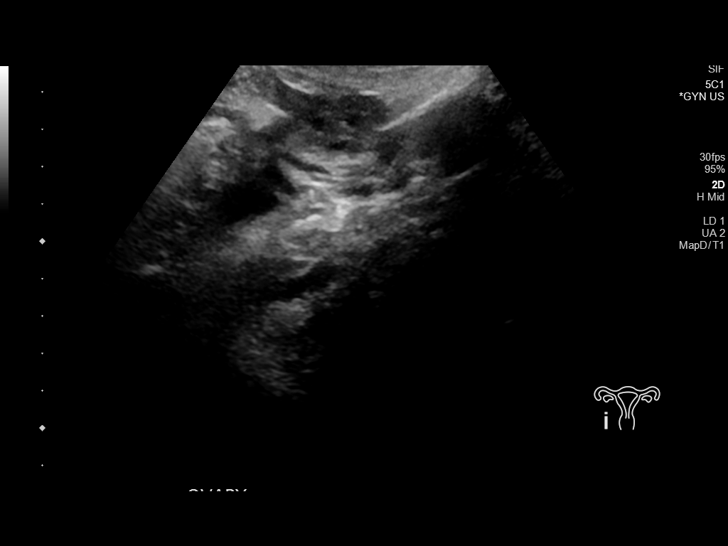
[im 36/106]
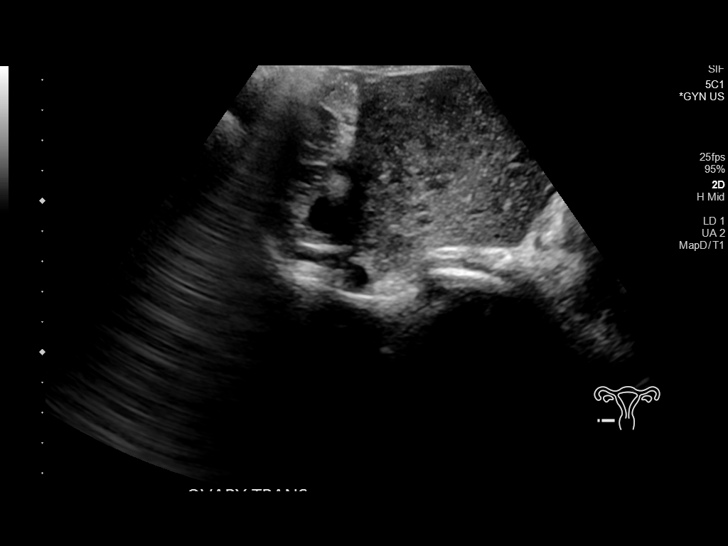
[im 44/106]
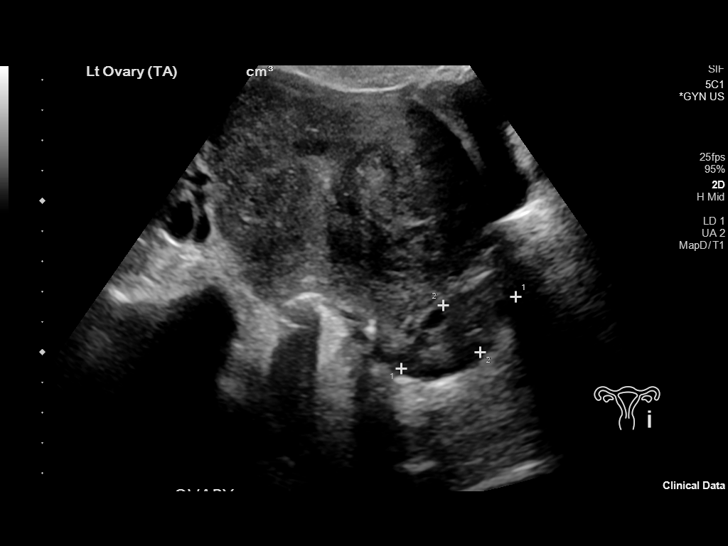
[im 53/106]
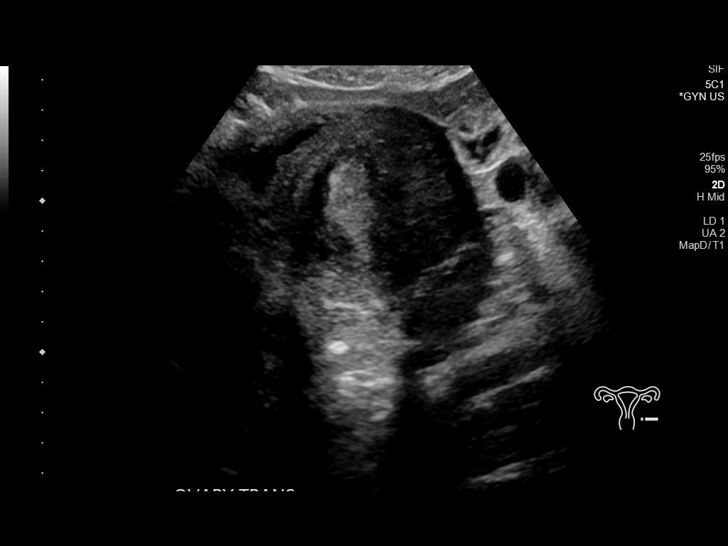
[im 62/106]
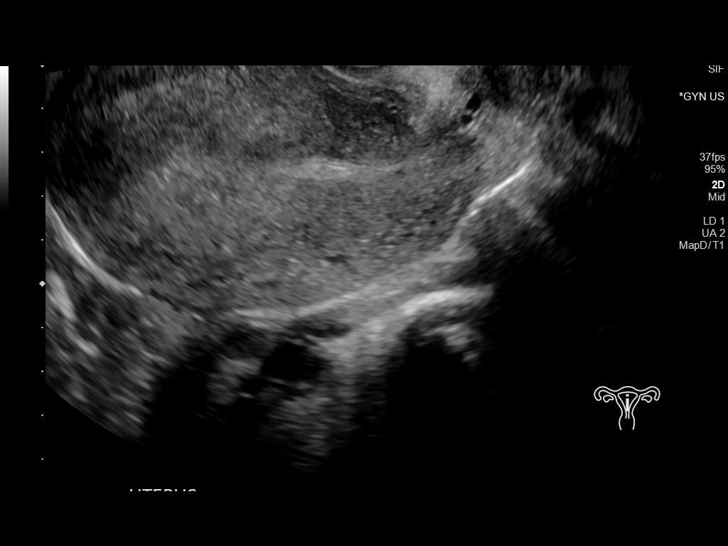
[im 71/106]
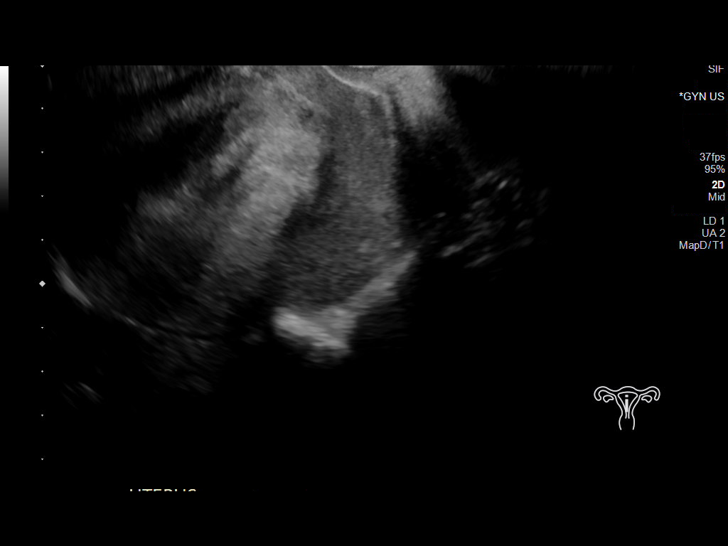
[im 79/106]
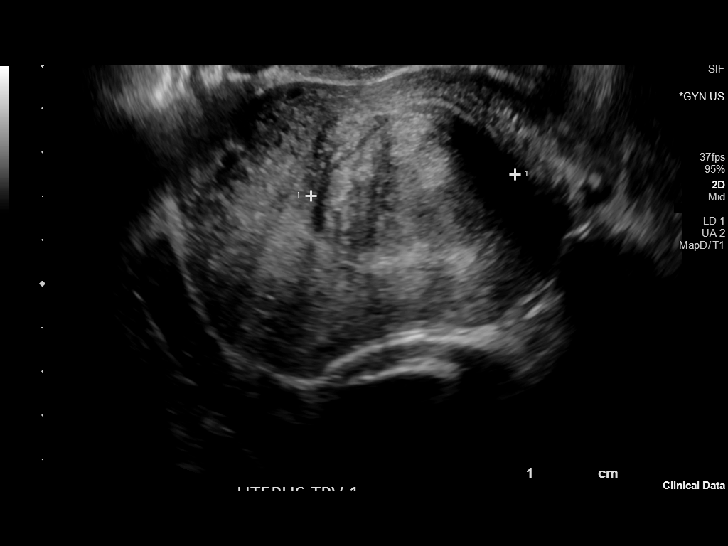
[im 88/106]
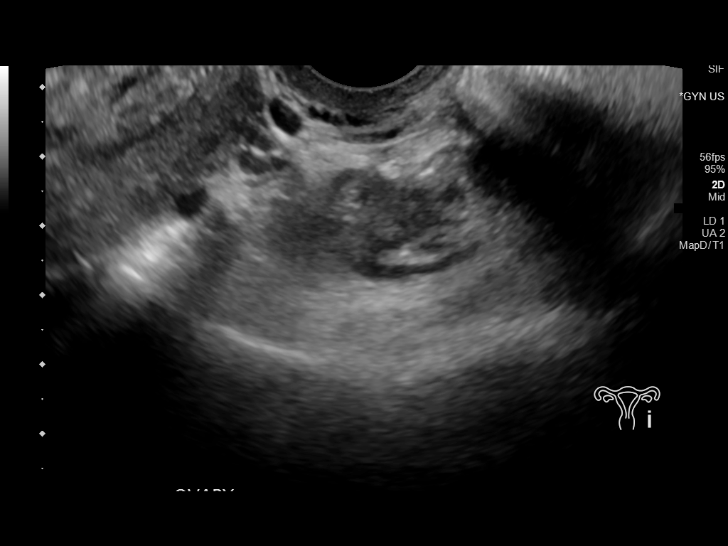
[im 97/106]
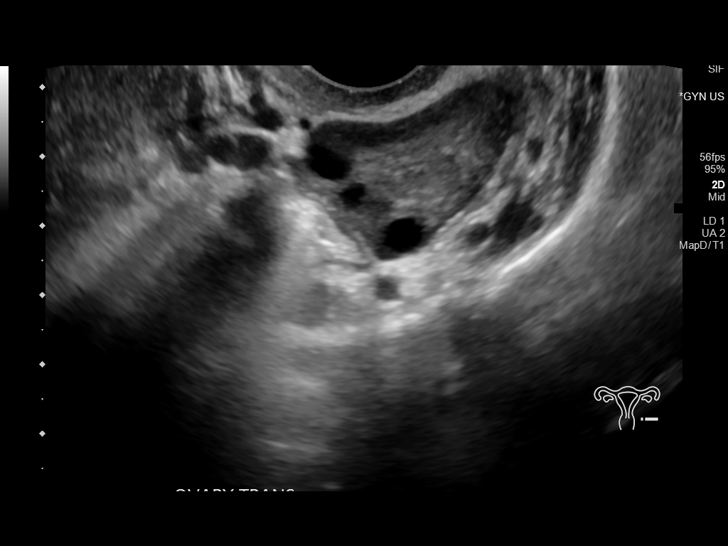
[im 106/106]
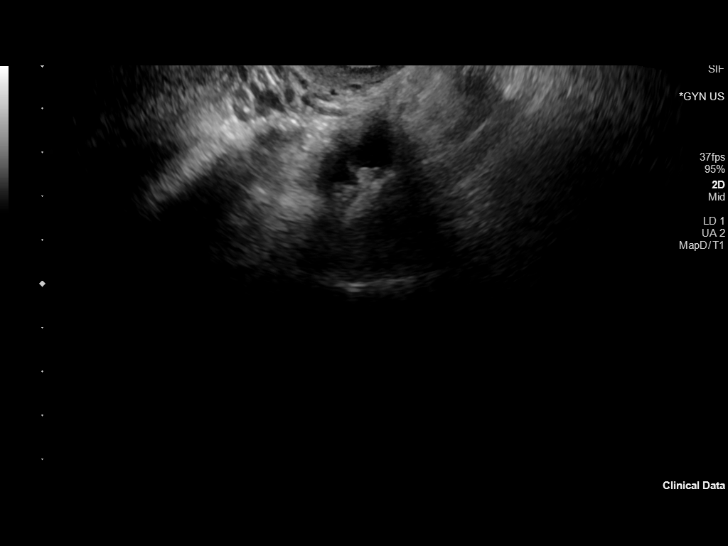

[13 of 25 positions shown; findings below may reference images not displayed]

FINDINGS: Uterus

Measurements: 11.4 x 6.5 x 0.3 cm = volume: 324 mL. Mass in the left
anterior uterus near the fundus measuring 5.2 x 4.1 x 4.7 cm. There
is evidence of posterior acoustic shadowing and there are areas of
increased echogenicity.

Endometrium

Thickness: 9 mm. The masslike area that shows posterior acoustic
shadowing is inseparable from the endometrium along its left lateral
margin.

Right ovary

Measurements: 3.2 x 1.7 x 2.6 cm = volume: 7.2 mL. Normal
appearance/no adnexal mass.

Left ovary

Measurements: 4.5 x 2.0 x 2.1 cm = volume: 9.8 mL. Normal
appearance/no adnexal mass.

Other findings

No abnormal free fluid.
IMPRESSION: 1. Large submucosal fibroid in the anterior left uterus.
2. Normal appearance of the ovaries.
3. No free fluid.

## 2021-02-15 ENCOUNTER — Other Ambulatory Visit: Payer: Self-pay

## 2021-02-15 ENCOUNTER — Ambulatory Visit
Admission: EM | Admit: 2021-02-15 | Discharge: 2021-02-15 | Disposition: A | Discharge status: Home / Self Care | Payer: Medicaid Other | Attending: Internal Medicine | Admitting: Internal Medicine

## 2021-02-15 DIAGNOSIS — L03012 Cellulitis of left finger: Secondary | ICD-10-CM

## 2021-02-15 DIAGNOSIS — L308 Other specified dermatitis: Secondary | ICD-10-CM

## 2021-02-15 MED ORDER — CEPHALEXIN 500 MG PO CAPS: 500.0000 mg | ORAL_CAPSULE | Freq: Four times a day (QID) | ORAL | 0 refills | Status: AC

## 2021-02-15 MED ORDER — PREDNISONE 20 MG PO TABS
40.0000 mg | ORAL_TABLET | Freq: Every day | ORAL | 0 refills | Status: AC
Start: 2021-02-15 — End: 2021-02-20

## 2021-02-15 NOTE — ED Provider Notes (Signed)
[name]    CSN: [id number] Arrival date & time: 02/15/21  1520      History   Chief Complaint Chief Complaint  Patient presents with   Rash    HPI Lori Perkins is a 50 y.o. female.   Patient presents with itchy rash to bilateral hands and upper arm that has been intermittent for the past few months.  Patient has been seen at this urgent care multiple times and was diagnosed with eczema.  Patient has been prescribed multiple creams as needed and triamcinolone.  Has also been given steroid injections.  Patient reports that these treatments provide temporary relief but the rash returns directly after treatment is complete.  Patient also reports that she has a swelling and inflammation noted to the left third digit of her hand that she is also concerned about.  Patient denies any known fevers.  Denies any numbness or tingling to the hands.   Rash  Past Medical History:  Diagnosis Date   GERD (gastroesophageal reflux disease)    History of abnormal cervical Pap smear 2013   2013 Pap smear LSIL+, HPV not detected, 2014 Colposcopy with benign tissue    History of sexually transmitted disease 01/16/2014   BV, trichomonas    Patient Active Problem List   Diagnosis Date Noted   Fibroids 06/22/2019   Well woman exam with routine gynecological exam 06/01/2019   Breast pain 06/01/2019   Menorrhagia with regular cycle 05/01/2019   History of sexually transmitted disease 05/17/2015   History of abnormal cervical Pap smear 05/17/2015   Hemorrhoids 05/17/2015   Cold intolerance 05/17/2015   Preventative health care 05/17/2015   Tobacco use disorder 05/17/2015    Past Surgical History:  Procedure Laterality Date   MULTIPLE TOOTH EXTRACTIONS      OB History     Gravida  0   Para  0   Term  0   Preterm  0   AB  0   Living  0      SAB  0   IAB  0   Ectopic  0   Multiple  0   Live Births  0            Home Medications    Prior to  Admission medications   Medication Sig Start Date End Date Taking? Authorizing Provider  cephALEXin (KEFLEX) 500 MG capsule Take 1 capsule (500 mg total) by mouth 4 (four) times daily for 5 days. 02/15/21 02/20/21 Yes Odis Luster, FNP  predniSONE (DELTASONE) 20 MG tablet Take 2 tablets (40 mg total) by mouth daily for 5 days. 02/15/21 02/20/21 Yes Odis Luster, FNP  acetaminophen (TYLENOL) 500 MG tablet Take 1,000 mg by mouth every 6 (six) hours as needed for mild pain.     [provider]  betamethasone dipropionate (DIPROLENE) 0.05 % ointment Apply topically 2 (two) times daily. Patient not taking: Reported on 10/08/2020 07/18/20   Wieters, Hallie C, PA-C  hydrOXYzine (ATARAX/VISTARIL) 25 MG tablet Take 1 tablet (25 mg total) by mouth every 6 (six) hours. Patient not taking: Reported on 10/08/2020 07/18/20   Wieters, Hallie C, PA-C  naproxen (NAPROSYN) 500 MG tablet Take 1 tablet (500 mg total) by mouth 2 (two) times daily as needed for mild pain. Patient not taking: Reported on 10/08/2020 02/06/20   Ok Edwards, PA-C  olopatadine (PATANOL) 0.1 % ophthalmic solution Place 1 drop into both eyes 2 (two) times daily. Patient not taking: Reported on  10/08/2020 07/18/20   Wieters, Hallie C, PA-C  triamcinolone cream (KENALOG) 0.1 % Apply 1 application topically 2 (two) times daily. 10/08/20   Scot Jun, FNP  diphenhydrAMINE (BENADRYL) 25 MG tablet Take 1 tablet (25 mg total) by mouth every 6 (six) hours as needed. 09/12/19 02/06/20  Hall-Potvin, [place], PA-C    Family History Family History  Problem Relation Age of Onset   Diabetes Mother    Hypertension Father    Stroke Father    Cancer Maternal Aunt    Breast cancer Maternal Aunt 59   Cancer Maternal Uncle    Diabetes Maternal Grandmother    Breast cancer Maternal Grandmother 80    Social History Social History   Tobacco Use   Smoking status: Every Day    Packs/day: 0.50    Years: 20.00    Pack years: 10.00    Types:  Cigarettes   Smokeless tobacco: Never  Vaping Use   Vaping Use: Former  Substance Use Topics   Alcohol use: Yes    Comment: occasionally   Drug use: Not Currently    Types: Marijuana     Allergies   Penicillins   Review of Systems Review of Systems Per HPI  Physical Exam Triage Vital Signs ED Triage Vitals [02/15/21 1536]  Enc Vitals Group     BP      Pulse Rate 92     Resp 18     Temp 98.4 F (36.9 C)     Temp Source Oral     SpO2 99 %     Weight      Height      Head Circumference      Peak Flow      Pain Score 0     Pain Loc      Pain Edu?      Excl. in [place]?    No data found.  Updated Vital Signs Pulse 92   Temp 98.4 F (36.9 C) (Oral)   Resp 18   SpO2 99%   Visual Acuity Right Eye Distance:   Left Eye Distance:   Bilateral Distance:    Right Eye Near:   Left Eye Near:    Bilateral Near:     Physical Exam Constitutional:      General: She is not in acute distress.    Appearance: Normal appearance. She is not toxic-appearing or diaphoretic.  HENT:     Head: Normocephalic and atraumatic.  Eyes:     Extraocular Movements: Extraocular movements intact.     Conjunctiva/sclera: Conjunctivae normal.  Pulmonary:     Effort: Pulmonary effort is normal.  Skin:    General: Skin is warm and dry.     Findings: Rash present. Rash is urticarial.     Comments: Bilateral hands with hyperpigmentation and dryness noted to palmar surface of bilateral hands, slight erythema noted. no open areas noted without significant erythema.  Patient has swelling to distal end of the left third digit as well as purulent drainage noted to medial side of nail.  Neurovascular intact.  Neurological:     General: No focal deficit present.     Mental Status: She is alert and oriented to person, place, and time. Mental status is at baseline.  Psychiatric:        Mood and Affect: Mood normal.        Behavior: Behavior normal.        Thought Content: Thought content normal.  Judgment: Judgment normal.     UC Treatments / Results  Labs (all labs ordered are listed, but only abnormal results are displayed) Labs Reviewed - No data to display  EKG   Radiology No results found.  Procedures Procedures (including critical care time)  Medications Ordered in UC Medications - No data to display  Initial Impression / Assessment and Plan / UC Course  I have reviewed the triage vital signs and the nursing notes.  Pertinent labs & imaging results that were available during my care of the patient were reviewed by me and considered in my medical decision making (see chart for details).     Rash is consistent with atopic dermatitis or eczema.  Patient also has evidence of paronychia or skin infection on exam.  Will treat with cephalexin x5 days.  Will also treat with prednisone steroid x5 days to decrease inflammation associated with rash. rash has been refractory to triamcinolone cream with this flareup.  Patient also advised that she should start taking a daily antihistamine as well.  Patient was advised that she needs to follow-up for further evaluation and management with dermatology.  Patient was provided contact information for dermatology.  No red flags on exam. Discussed strict return precautions. Patient verbalized understanding and is agreeable with plan.  Final Clinical Impressions(s) / UC Diagnoses   Final diagnoses:  Other eczema  Paronychia of finger of left hand     Discharge Instructions      You have been prescribed an antibiotic named cephalexin that will treat the infection of your finger.  You have also been prescribed prednisone steroid to decrease inflammation associated with a rash.  Please call provided contact information for dermatology on Monday for further evaluation and management.     ED Prescriptions     Medication Sig Dispense Auth. Provider   cephALEXin (KEFLEX) 500 MG capsule Take 1 capsule (500 mg total) by mouth 4  (four) times daily for 5 days. 20 capsule Odis Luster, FNP   predniSONE (DELTASONE) 20 MG tablet Take 2 tablets (40 mg total) by mouth daily for 5 days. 10 tablet Odis Luster, FNP      PDMP not reviewed this encounter.   Odis Luster, FNP 02/15/21 [phone]

## 2021-02-15 NOTE — Discharge Instructions (Addendum)
You have been prescribed an antibiotic named cephalexin that will treat the infection of your finger.  You have also been prescribed prednisone steroid to decrease inflammation associated with a rash.  Please call provided contact information for dermatology on Monday for further evaluation and management.

## 2021-02-15 NOTE — ED Triage Notes (Signed)
Pt states she has been here multiple times for rashes on bilateral hands and was dx with eczema. States treatment has helped minimally. Would like to be reevaluated.

## 2021-03-11 ENCOUNTER — Encounter: Payer: Self-pay | Admitting: Emergency Medicine

## 2021-03-11 ENCOUNTER — Other Ambulatory Visit: Payer: Self-pay

## 2021-03-11 ENCOUNTER — Ambulatory Visit
Admission: EM | Admit: 2021-03-11 | Discharge: 2021-03-11 | Disposition: A | Discharge status: Home / Self Care | Payer: Medicaid Other | Attending: Internal Medicine | Admitting: Internal Medicine

## 2021-03-11 DIAGNOSIS — L308 Other specified dermatitis: Secondary | ICD-10-CM

## 2021-03-11 MED ORDER — TRIAMCINOLONE ACETONIDE 0.025 % EX OINT
1.0000 | TOPICAL_OINTMENT | Freq: Two times a day (BID) | CUTANEOUS | 0 refills | Status: DC
Start: 2021-03-11 — End: 2021-05-23

## 2021-03-11 NOTE — ED Provider Notes (Addendum)
[name]    CSN: [id number] Arrival date & time: 03/11/21  4492      History   Chief Complaint Chief Complaint  Patient presents with   Rash   Medication Refill    HPI Lori Perkins is a 50 y.o. female.   Patient presents with a flareup of eczema that started yesterday.  Patient has been seen at this urgent care multiple times for similar rash and eczema flareups.  Has been prescribed prednisone steroid pills as well as steroid creams that have provided relief of symptoms.  Patient has been advised multiple times to follow-up with dermatology or PCP as well.  Patient reports that she has not yet followed up with dermatology or PCP yet.  Patient denies any fevers but notes that rash is itchy.  Rash is present to bilateral hands, forearms, anterior portion of elbows.   Rash Medication Refill  Past Medical History:  Diagnosis Date   GERD (gastroesophageal reflux disease)    History of abnormal cervical Pap smear 2013   2013 Pap smear LSIL+, HPV not detected, 2014 Colposcopy with benign tissue    History of sexually transmitted disease 01/16/2014   BV, trichomonas    Patient Active Problem List   Diagnosis Date Noted   Fibroids 06/22/2019   Well woman exam with routine gynecological exam 06/01/2019   Breast pain 06/01/2019   Menorrhagia with regular cycle 05/01/2019   History of sexually transmitted disease 05/17/2015   History of abnormal cervical Pap smear 05/17/2015   Hemorrhoids 05/17/2015   Cold intolerance 05/17/2015   Preventative health care 05/17/2015   Tobacco use disorder 05/17/2015    Past Surgical History:  Procedure Laterality Date   MULTIPLE TOOTH EXTRACTIONS      OB History     Gravida  0   Para  0   Term  0   Preterm  0   AB  0   Living  0      SAB  0   IAB  0   Ectopic  0   Multiple  0   Live Births  0            Home Medications    Prior to Admission medications   Medication Sig Start Date  End Date Taking? Authorizing Provider  triamcinolone (KENALOG) 0.025 % ointment Apply 1 application topically 2 (two) times daily. 03/11/21  Yes Jayleen Afonso, Hildred Alamin E, FNP  acetaminophen (TYLENOL) 500 MG tablet Take 1,000 mg by mouth every 6 (six) hours as needed for mild pain.     [provider]  betamethasone dipropionate (DIPROLENE) 0.05 % ointment Apply topically 2 (two) times daily. Patient not taking: Reported on 10/08/2020 07/18/20   Wieters, Hallie C, PA-C  hydrOXYzine (ATARAX/VISTARIL) 25 MG tablet Take 1 tablet (25 mg total) by mouth every 6 (six) hours. Patient not taking: Reported on 10/08/2020 07/18/20   Wieters, Hallie C, PA-C  naproxen (NAPROSYN) 500 MG tablet Take 1 tablet (500 mg total) by mouth 2 (two) times daily as needed for mild pain. Patient not taking: Reported on 10/08/2020 02/06/20   Ok Edwards, PA-C  olopatadine (PATANOL) 0.1 % ophthalmic solution Place 1 drop into both eyes 2 (two) times daily. Patient not taking: Reported on 10/08/2020 07/18/20   Wieters, Madelynn Done C, PA-C  diphenhydrAMINE (BENADRYL) 25 MG tablet Take 1 tablet (25 mg total) by mouth every 6 (six) hours as needed. 09/12/19 02/06/20  Hall-Potvin, [place], PA-C    Family History Family History  Problem Relation Age of Onset   Diabetes Mother    Hypertension Father    Stroke Father    Cancer Maternal Aunt    Breast cancer Maternal Aunt 33   Cancer Maternal Uncle    Diabetes Maternal Grandmother    Breast cancer Maternal Grandmother 80    Social History Social History   Tobacco Use   Smoking status: Every Day    Packs/day: 0.50    Years: 20.00    Pack years: 10.00    Types: Cigarettes   Smokeless tobacco: Never  Vaping Use   Vaping Use: Former  Substance Use Topics   Alcohol use: Yes    Comment: occasionally   Drug use: Not Currently    Types: Marijuana     Allergies   Penicillins   Review of Systems Review of Systems Per HPI  Physical Exam Triage Vital Signs ED Triage Vitals  [03/11/21 1109]  Enc Vitals Group     BP (!) 178/78     Pulse Rate 80     Resp 16     Temp 98.4 F (36.9 C)     Temp Source Oral     SpO2 100 %     Weight      Height      Head Circumference      Peak Flow      Pain Score      Pain Loc      Pain Edu?      Excl. in [place]?    No data found.  Updated Vital Signs BP (!) 178/78 (BP Location: Right Arm)   Pulse 80   Temp 98.4 F (36.9 C) (Oral)   Resp 16   SpO2 100%   Visual Acuity Right Eye Distance:   Left Eye Distance:   Bilateral Distance:    Right Eye Near:   Left Eye Near:    Bilateral Near:     Physical Exam Constitutional:      General: She is not in acute distress.    Appearance: Normal appearance. She is not toxic-appearing or diaphoretic.  HENT:     Head: Normocephalic and atraumatic.  Eyes:     Extraocular Movements: Extraocular movements intact.     Conjunctiva/sclera: Conjunctivae normal.  Pulmonary:     Effort: Pulmonary effort is normal.  Skin:    Comments: Bilateral hands with hyperpigmentation and dryness noted to palmar surface of bilateral hands, bilateral forearms, anterior; portion of elbows; slight erythema noted. no open areas noted without significant erythema.  Neurovascular intact. No signs of bacterial infection.   Neurological:     General: No focal deficit present.     Mental Status: She is alert and oriented to person, place, and time. Mental status is at baseline.  Psychiatric:        Mood and Affect: Mood normal.        Behavior: Behavior normal.        Thought Content: Thought content normal.        Judgment: Judgment normal.     UC Treatments / Results  Labs (all labs ordered are listed, but only abnormal results are displayed) Labs Reviewed - No data to display  EKG   Radiology No results found.  Procedures Procedures (including critical care time)  Medications Ordered in UC Medications - No data to display  Initial Impression / Assessment and Plan / UC Course   I have reviewed the triage vital signs and the nursing notes.  Pertinent labs &  imaging results that were available during my care of the patient were reviewed by me and considered in my medical decision making (see chart for details).     Patient was advised of the importance of following up with dermatology for further evaluation and management.  Will refill triamcinolone ointment as patient has had success with flareup of eczema with this in the past.  Patient requesting ointment as opposed to the cream as this has provided more relief than cream in the past.  Patient also advised to take over-the-counter allergy pills such as Zyrtec or claritin.  Patient voiced understanding and was agreeable plan.  Patient has elevated blood pressure reading today.  No signs of hypertensive urgency.  Patient to follow-up with PCP for further evaluation and management of hypertension. Final Clinical Impressions(s) / UC Diagnoses   Final diagnoses:  Other eczema     Discharge Instructions      Your cream has been refilled.  Please follow-up with dermatology for further evaluation and management as they will have to manage this in the future.     ED Prescriptions     Medication Sig Dispense Auth. Provider   triamcinolone (KENALOG) 0.025 % ointment Apply 1 application topically 2 (two) times daily. 80 g Teodora Medici, [place]      PDMP not reviewed this encounter.   Teodora Medici, [place] 03/11/21 [place], [place], [place] 03/11/21 1140

## 2021-03-11 NOTE — ED Triage Notes (Signed)
States she was dx with eczema here, requesting prescription refill

## 2021-03-11 NOTE — Discharge Instructions (Addendum)
Your cream has been refilled.  Please follow-up with dermatology for further evaluation and management as they will have to manage this in the future.

## 2021-05-23 ENCOUNTER — Other Ambulatory Visit: Payer: Self-pay

## 2021-05-23 ENCOUNTER — Encounter: Payer: Self-pay | Admitting: Emergency Medicine

## 2021-05-23 ENCOUNTER — Ambulatory Visit
Admission: EM | Admit: 2021-05-23 | Discharge: 2021-05-23 | Disposition: A | Discharge status: Home / Self Care | Payer: Medicaid Other | Attending: Internal Medicine | Admitting: Internal Medicine

## 2021-05-23 DIAGNOSIS — L2089 Other atopic dermatitis: Secondary | ICD-10-CM

## 2021-05-23 MED ORDER — TRIAMCINOLONE ACETONIDE 0.1 % EX OINT: TOPICAL_OINTMENT | Freq: Two times a day (BID) | CUTANEOUS | 0 refills | Status: DC

## 2021-05-23 MED ORDER — HYDROXYZINE HCL 25 MG PO TABS: 25.0000 mg | ORAL_TABLET | Freq: Four times a day (QID) | ORAL | 0 refills | Status: DC | PRN

## 2021-05-23 NOTE — ED Triage Notes (Signed)
Patient c/o eczema on her hands, arms and neck.  The areas are itching.  Patient has been taken Benadryl, Trimacelone ointment.

## 2021-05-23 NOTE — ED Provider Notes (Signed)
[name]    CSN: [id number] Arrival date & time: 05/23/21  1557      History   Chief Complaint Chief Complaint  Patient presents with   Rash    HPI Lori Perkins is a 51 y.o. female.   Patient presents with rash to bilateral hands, arms, posterior neck that flared up over the past few days.  She has been seen several times at this urgent care for atopic dermatitis and treated with steroid creams, steroid pills, antibiotics according to the physical exam.  She has been told to follow-up with dermatology or PCP but has not yet done so.  Denies any changes to the environment.  Rash is consistent with previous rashes.   Rash  Past Medical History:  Diagnosis Date   GERD (gastroesophageal reflux disease)    History of abnormal cervical Pap smear 2013   2013 Pap smear LSIL+, HPV not detected, 2014 Colposcopy with benign tissue    History of sexually transmitted disease 01/16/2014   BV, trichomonas    Patient Active Problem List   Diagnosis Date Noted   Fibroids 06/22/2019   Well woman exam with routine gynecological exam 06/01/2019   Breast pain 06/01/2019   Menorrhagia with regular cycle 05/01/2019   History of sexually transmitted disease 05/17/2015   History of abnormal cervical Pap smear 05/17/2015   Hemorrhoids 05/17/2015   Cold intolerance 05/17/2015   Preventative health care 05/17/2015   Tobacco use disorder 05/17/2015    Past Surgical History:  Procedure Laterality Date   MULTIPLE TOOTH EXTRACTIONS      OB History     Gravida  0   Para  0   Term  0   Preterm  0   AB  0   Living  0      SAB  0   IAB  0   Ectopic  0   Multiple  0   Live Births  0            Home Medications    Prior to Admission medications   Medication Sig Start Date End Date Taking? Authorizing Provider  acetaminophen (TYLENOL) 500 MG tablet Take 1,000 mg by mouth every 6 (six) hours as needed for mild pain.    Yes [provider]  hydrOXYzine (ATARAX) 25 MG tablet Take 1 tablet (25 mg total) by mouth every 6 (six) hours as needed for itching. 05/23/21  Yes Chalonda Schlatter, Hildred Alamin E, FNP  triamcinolone 0.1%-Aquaphor equivlanet 1:1 ointment mixture Apply topically 2 (two) times daily. 05/23/21  Yes Riven Mabile, Hildred Alamin E, FNP  betamethasone dipropionate (DIPROLENE) 0.05 % ointment Apply topically 2 (two) times daily. Patient not taking: Reported on 10/08/2020 07/18/20   Wieters, Hallie C, PA-C  naproxen (NAPROSYN) 500 MG tablet Take 1 tablet (500 mg total) by mouth 2 (two) times daily as needed for mild pain. Patient not taking: Reported on 10/08/2020 02/06/20   Ok Edwards, PA-C  olopatadine (PATANOL) 0.1 % ophthalmic solution Place 1 drop into both eyes 2 (two) times daily. Patient not taking: Reported on 10/08/2020 07/18/20   Wieters, Madelynn Done C, PA-C  diphenhydrAMINE (BENADRYL) 25 MG tablet Take 1 tablet (25 mg total) by mouth every 6 (six) hours as needed. 09/12/19 02/06/20  Hall-Potvin, [place], PA-C    Family History Family History  Problem Relation Age of Onset   Diabetes Mother    Hypertension Father    Stroke Father    Cancer Maternal Aunt    Breast cancer  Maternal Aunt 59   Cancer Maternal Uncle    Diabetes Maternal Grandmother    Breast cancer Maternal Grandmother 80    Social History Social History   Tobacco Use   Smoking status: Every Day    Packs/day: 0.50    Years: 20.00    Pack years: 10.00    Types: Cigarettes   Smokeless tobacco: Never  Vaping Use   Vaping Use: Former  Substance Use Topics   Alcohol use: Yes    Comment: occasionally   Drug use: Not Currently    Types: Marijuana     Allergies   Penicillins   Review of Systems Review of Systems Per HPI  Physical Exam Triage Vital Signs ED Triage Vitals  Enc Vitals Group     BP 05/23/21 1638 132/77     Pulse Rate 05/23/21 1638 76     Resp 05/23/21 1638 18     Temp 05/23/21 1638 98.3 F (36.8 C)     Temp Source 05/23/21 1638 Oral     SpO2  05/23/21 1638 100 %     Weight 05/23/21 1640 125 lb (56.7 kg)     Height 05/23/21 1640 5\' 7"  (1.702 m)     Head Circumference --      Peak Flow --      Pain Score 05/23/21 1640 7     Pain Loc --      Pain Edu? --      Excl. in [place]? --    No data found.  Updated Vital Signs BP 132/77 (BP Location: Left Arm)    Pulse 76    Temp 98.3 F (36.8 C) (Oral)    Resp 18    Ht 5\' 7"  (1.702 m)    Wt 125 lb (56.7 kg)    SpO2 100%    BMI 19.58 kg/m   Visual Acuity Right Eye Distance:   Left Eye Distance:   Bilateral Distance:    Right Eye Near:   Left Eye Near:    Bilateral Near:     Physical Exam Constitutional:      General: She is not in acute distress.    Appearance: Normal appearance. She is not toxic-appearing or diaphoretic.  HENT:     Head: Normocephalic and atraumatic.  Eyes:     Extraocular Movements: Extraocular movements intact.     Conjunctiva/sclera: Conjunctivae normal.  Pulmonary:     Effort: Pulmonary effort is normal.  Skin:    Findings: Rash present.     Comments: Bilateral hands with hyperpigmentation and dryness noted to palmar surface of bilateral hands, slight erythema noted. no open areas noted without significant erythema.    Neurovascular intact.  Similar rash present to posterior neck as well as right forearm.  Neurological:     General: No focal deficit present.     Mental Status: She is alert and oriented to person, place, and time. Mental status is at baseline.  Psychiatric:        Mood and Affect: Mood normal.        Behavior: Behavior normal.        Thought Content: Thought content normal.        Judgment: Judgment normal.     UC Treatments / Results  Labs (all labs ordered are listed, but only abnormal results are displayed) Labs Reviewed - No data to display  EKG   Radiology No results found.  Procedures Procedures (including critical care time)  Medications Ordered in UC Medications -  No data to display  Initial Impression /  Assessment and Plan / UC Course  I have reviewed the triage vital signs and the nursing notes.  Pertinent labs & imaging results that were available during my care of the patient were reviewed by me and considered in my medical decision making (see chart for details).     Rash is consistent with atopic dermatitis.  Will treat with triamcinolone/Aquaphor cream.  Hydroxyzine antihistamine also prescribed per patient.  Reiterated the importance of following up with dermatology as this is a chronic issue as opposed to an acute issue.  Patient verbalized understanding and was agreeable with plan. Final Clinical Impressions(s) / UC Diagnoses   Final diagnoses:  Other atopic dermatitis     Discharge Instructions      Please call provided contact information for dermatology for eczema as this is a chronic issue.      ED Prescriptions     Medication Sig Dispense Auth. Provider   hydrOXYzine (ATARAX) 25 MG tablet Take 1 tablet (25 mg total) by mouth every 6 (six) hours as needed for itching. 12 tablet [place], [place] E, [place]   triamcinolone 0.1%-Aquaphor equivlanet 1:1 ointment mixture Apply topically 2 (two) times daily. 240 g Teodora Medici, [place]      PDMP not reviewed this encounter.   Teodora Medici, [place] 05/23/21 1733

## 2021-05-23 NOTE — Discharge Instructions (Addendum)
Please call provided contact information for dermatology for eczema as this is a chronic issue.

## 2021-08-13 ENCOUNTER — Ambulatory Visit: Payer: Self-pay | Attending: Family Medicine | Admitting: Family Medicine

## 2021-08-13 ENCOUNTER — Encounter: Payer: Self-pay | Admitting: Family Medicine

## 2021-08-13 VITALS — BP 118/78 | HR 103 | Ht 67.0 in | Wt 119.2 lb

## 2021-08-13 DIAGNOSIS — L309 Dermatitis, unspecified: Secondary | ICD-10-CM | POA: Insufficient documentation

## 2021-08-13 DIAGNOSIS — Z13228 Encounter for screening for other metabolic disorders: Secondary | ICD-10-CM

## 2021-08-13 DIAGNOSIS — Z1211 Encounter for screening for malignant neoplasm of colon: Secondary | ICD-10-CM

## 2021-08-13 DIAGNOSIS — L308 Other specified dermatitis: Secondary | ICD-10-CM

## 2021-08-13 DIAGNOSIS — Z1159 Encounter for screening for other viral diseases: Secondary | ICD-10-CM

## 2021-08-13 DIAGNOSIS — D250 Submucous leiomyoma of uterus: Secondary | ICD-10-CM | POA: Insufficient documentation

## 2021-08-13 DIAGNOSIS — D251 Intramural leiomyoma of uterus: Secondary | ICD-10-CM

## 2021-08-13 MED ORDER — TRIAMCINOLONE ACETONIDE 0.1 % EX OINT: TOPICAL_OINTMENT | Freq: Two times a day (BID) | CUTANEOUS | 1 refills | Status: DC

## 2021-08-13 MED ORDER — NAPROXEN 500 MG PO TABS: 500.0000 mg | ORAL_TABLET | Freq: Two times a day (BID) | ORAL | 0 refills | Status: DC | PRN

## 2021-08-13 MED ORDER — HYDROXYZINE HCL 25 MG PO TABS: 25.0000 mg | ORAL_TABLET | Freq: Three times a day (TID) | ORAL | 1 refills | Status: DC | PRN

## 2021-08-13 MED ORDER — BETAMETHASONE DIPROPIONATE 0.05 % EX OINT: TOPICAL_OINTMENT | Freq: Two times a day (BID) | CUTANEOUS | 1 refills | Status: DC

## 2021-08-13 MED ORDER — PREDNISONE 20 MG PO TABS: 20.0000 mg | ORAL_TABLET | Freq: Every day | ORAL | 0 refills | Status: DC

## 2021-08-13 NOTE — Progress Notes (Signed)
Has rash on hands, back and legs. ?

## 2021-08-13 NOTE — Patient Instructions (Signed)
Eczema Eczema refers to a group of skin conditions that cause skin to become rough and inflamed. Each type of eczema has different triggers, symptoms, and treatments. Eczema of any type is usually itchy. Symptoms range from mild to severe. Eczema is not spread from person to person (is not contagious). It can appear on different parts of the body at different times. One person's eczema may look different from another person's eczema. What are the causes? The exact cause of this condition is not known. However, exposure to certain environmental factors, irritants, and allergens can make the condition worse. What are the signs or symptoms? Symptoms of this condition depend on the type of eczema you have. The types include: Contact dermatitis. There are two kinds: Irritant contact dermatitis. This happens when something irritates the skin and causes a rash. Allergic contact dermatitis. This happens when your skin comes in contact with something you are allergic to (allergens). This can include poison ivy, chemicals, or medicines that were applied to your skin. Atopic dermatitis. This is a long-term (chronic) skin disease that keeps coming back (recurring). It is the most common type of eczema. Usual symptoms are a red rash and itchy, dry, scaly skin. It usually starts showing signs in infancy and can last through adulthood. Dyshidrotic eczema. This is a form of eczema on the hands and feet. It shows up as very itchy, fluid-filled blisters. It can affect people of any age but is more common before age 40. Hand eczema. This causes very itchy areas of skin on the palms and sides of the hands and fingers. This type of eczema is common in industrial jobs where you may be exposed to different types of irritants. Lichen simplex chronicus. This type of eczema occurs when a person constantly scratches one area of the body. Repeated scratching of the area leads to thickened skin (lichenification). This condition can  accompany other types of eczema. It is more common in adults but may also be seen in children. Nummular eczema. This is a common type of eczema that most often affects the lower legs and the backs of the hands. It typically causes an itchy, red, circular, crusty lesion (plaque). Scratching may become a habit and can cause bleeding. Nummular eczema occurs most often in middle-aged or older people. Seborrheic dermatitis. This is a common skin disease that mainly affects the scalp. It may also affect other oily areas of the body, such as the face, sides of the nose, eyebrows, ears, eyelids, and chest. It is marked by small scaling and redness of the skin (erythema). This can affect people of all ages. In infants, this condition is called cradle cap. Stasis dermatitis. This is a common skin disease that can cause itching, scaling, and hyperpigmentation, usually on the legs and feet. It occurs most often in people who have a condition that prevents blood from being pumped through the veins in the legs (chronic venous insufficiency). Stasis dermatitis is a chronic condition that needs long-term management. How is this diagnosed? This condition may be diagnosed based on: A physical exam of your skin. Your medical history. Skin patch tests. These tests involve using patches that contain possible allergens and placing them on your back. Your health care provider will check in a few days to see if an allergic reaction occurred. How is this treated? Treatment for eczema is based on the type of eczema you have. You may be given hydrocortisone steroid medicine or antihistamines. These can relieve itching quickly and help reduce inflammation.   These may be prescribed or purchased over the counter, depending on the strength that is needed. Follow these instructions at home: Take or apply over-the-counter and prescription medicines only as told by your health care provider. Use creams or ointments to moisturize your  skin. Do not use lotions. Learn what triggers or irritates your symptoms so you can avoid these things. Treat symptom flare-ups quickly. Do not scratch your skin. This can make your rash worse. Keep all follow-up visits. This is important. Where to find more information American Academy of Dermatology: aad.org National Eczema Association: nationaleczema.org The Society for Pediatric Dermatology: pedsderm.net Contact a health care provider if: You have severe itching, even with treatment. You scratch your skin regularly until it bleeds. Your rash looks different than usual. Your skin is painful, swollen, or more red than usual. You have a fever. Summary Eczema refers to a group of skin conditions that cause skin to become rough and inflamed. Each type has different triggers. Eczema of any type causes itching that may range from mild to severe. Treatment varies based on the type of eczema you have. Hydrocortisone steroid medicine or antihistamines can help with itching and inflammation. Protecting your skin is the best way to prevent eczema. Use creams or ointments to moisturize your skin. Avoid triggers and irritants. Treat flare-ups quickly. This information is not intended to replace advice given to you by your health care provider. Make sure you discuss any questions you have with your health care provider. Document Revised: 02/05/2020 Document Reviewed: 02/05/2020 Elsevier Patient Education  2022 Elsevier Inc.  

## 2021-08-13 NOTE — Progress Notes (Signed)
? ?Subjective:  ?Patient ID: Lori Perkins, female    DOB: 06-03-1970  Age: 51 y.o. MRN: [id number] ? ?CC: Rash ? ? ?HPI ?Lori Perkins is a 51 y.o. year old female with a history of abnormal uterine bleed. ?The first 3 days of her period her bleeding is heavy but then it lightens up subsequent days. ? ?Interval History: ?She complains of rash on her hands, back and legs.  She has had multiple ED visits for the same. ?She has been using [employer]. States rash got worse on her palms, flexor aspects of her forearm. She thinks rash is drying up but is now starting up on her back  and legs. ?He has not seen a Dermatologist yet. ? ?During monthly cycles she feels a knot in her right lower quadrant. ?Pelvic ultrasound from 04/2019 revealed: ?IMPRESSION: ?1. Large submucosal fibroid in the anterior left uterus. ?2. Normal appearance of the ovaries. ?3. No free fluid. ? ?Past Medical History:  ?Diagnosis Date  ? GERD (gastroesophageal reflux disease)   ? History of abnormal cervical Pap smear 2013  ? 2013 Pap smear LSIL+, HPV not detected, 2014 Colposcopy with benign tissue   ? History of sexually transmitted disease 01/16/2014  ? BV, trichomonas  ? ? ?Past Surgical History:  ?Procedure Laterality Date  ? MULTIPLE TOOTH EXTRACTIONS    ? ? ?Family History  ?Problem Relation Age of Onset  ? Diabetes Mother   ? Hypertension Father   ? Stroke Father   ? Cancer Maternal Aunt   ? Breast cancer Maternal Aunt 82  ? Cancer Maternal Uncle   ? Diabetes Maternal Grandmother   ? Breast cancer Maternal Grandmother 61  ? ? ?Social History  ? ?Socioeconomic History  ? Marital status: Single  ?  Spouse name: Not on file  ? Number of children: Not on file  ? Years of education: Not on file  ? Highest education level: Some college, no degree  ?Occupational History  ? Not on file  ?Tobacco Use  ? Smoking status: Every Day  ?  Packs/day: 0.50  ?  Years: 20.00  ?  Pack years: 10.00  ?  Types: Cigarettes  ? Smokeless tobacco: Never   ?Vaping Use  ? Vaping Use: Former  ?Substance and Sexual Activity  ? Alcohol use: Yes  ?  Comment: occasionally  ? Drug use: Not Currently  ?  Types: Marijuana  ? Sexual activity: Yes  ?  Birth control/protection: Condom  ?Other Topics Concern  ? Not on file  ?Social History Narrative  ? Not on file  ? ?Social Determinants of Health  ? ?Financial Resource Strain: Not on file  ?Food Insecurity: Not on file  ?Transportation Needs: Not on file  ?Physical Activity: Not on file  ?Stress: Not on file  ?Social Connections: Not on file  ? ? ?Allergies  ?Allergen Reactions  ? Penicillins   ?  Has patient had a PCN reaction causing immediate rash, facial/tongue/throat swelling, SOB or lightheadedness with hypotension: Unknown ?Has patient had a PCN reaction causing severe rash involving mucus membranes or skin necrosis: Unknown ?Has patient had a PCN reaction that required hospitalization: Unknown ?Has patient had a PCN reaction occurring within the last 10 years: No ?If all of the above answers are "NO", then may proceed with Cephalosporin use.  ? ? ?Outpatient Medications Prior to Visit  ?Medication Sig Dispense Refill  ? acetaminophen (TYLENOL) 500 MG tablet Take 1,000 mg by mouth every 6 (six) hours as  needed for mild pain.     ? olopatadine (PATANOL) 0.1 % ophthalmic solution Place 1 drop into both eyes 2 (two) times daily. 5 mL 12  ? betamethasone dipropionate (DIPROLENE) 0.05 % ointment Apply topically 2 (two) times daily. 45 g 0  ? hydrOXYzine (ATARAX) 25 MG tablet Take 1 tablet (25 mg total) by mouth every 6 (six) hours as needed for itching. 12 tablet 0  ? naproxen (NAPROSYN) 500 MG tablet Take 1 tablet (500 mg total) by mouth 2 (two) times daily as needed for mild pain. 60 tablet 0  ? triamcinolone 0.1%-Aquaphor equivlanet 1:1 ointment mixture Apply topically 2 (two) times daily. 240 g 0  ? ?No facility-administered medications prior to visit.  ? ? ? ?ROS ?Review of Systems  ?Constitutional:  Negative for activity  change, appetite change and fatigue.  ?HENT:  Negative for congestion, sinus pressure and sore throat.   ?Eyes:  Negative for visual disturbance.  ?Respiratory:  Negative for cough, chest tightness, shortness of breath and wheezing.   ?Cardiovascular:  Negative for chest pain and palpitations.  ?Gastrointestinal:  Negative for abdominal distention, abdominal pain and constipation.  ?Endocrine: Negative for polydipsia.  ?Genitourinary:  Positive for menstrual problem. Negative for dysuria and frequency.  ?Musculoskeletal:  Negative for arthralgias and back pain.  ?Skin:  Positive for rash.  ?Neurological:  Negative for tremors, light-headedness and numbness.  ?Hematological:  Does not bruise/bleed easily.  ?Psychiatric/Behavioral:  Negative for agitation and behavioral problems.   ? ?Objective:  ?BP 118/78   Pulse (!) 103   Ht '5\' 7"'$  (1.702 m)   Wt 119 lb 3.2 oz (54.1 kg)   SpO2 100%   BMI 18.67 kg/m?  ? ? ?  08/13/2021  ?  4:14 PM 05/23/2021  ?  4:40 PM 05/23/2021  ?  4:38 PM  ?BP/Weight  ?Systolic BP 250  539  ?Diastolic BP 78  77  ?Wt. (Lbs) 119.2 125   ?BMI 18.67 kg/m2 19.58 kg/m2   ? ? ? ? ?Physical Exam ?Constitutional:   ?   Appearance: She is well-developed.  ?Cardiovascular:  ?   Rate and Rhythm: Normal rate.  ?   Heart sounds: Normal heart sounds. No murmur heard. ?Pulmonary:  ?   Effort: Pulmonary effort is normal.  ?   Breath sounds: Normal breath sounds. No wheezing or rales.  ?Chest:  ?   Chest wall: No tenderness.  ?Abdominal:  ?   General: Bowel sounds are normal. There is no distension.  ?   Palpations: Abdomen is soft. There is no mass.  ?   Tenderness: There is no abdominal tenderness.  ?Musculoskeletal:     ?   General: Normal range of motion.  ?   Right lower leg: No edema.  ?   Left lower leg: No edema.  ?Neurological:  ?   Mental Status: She is alert and oriented to person, place, and time.  ?Psychiatric:     ?   Mood and Affect: Mood normal.  ? ? ? ?  Latest Ref Rng & Units 04/13/2019  ?  7:50  AM 09/26/2015  ?  2:11 PM 05/17/2015  ? 11:41 AM  ?CMP  ?Glucose 70 - 99 mg/dL 103   76   90    ?BUN 6 - 20 mg/dL '11   10   12    '$ ?Creatinine 0.44 - 1.00 mg/dL 0.80   0.94   0.82    ?Sodium 135 - 145 mmol/L 138   140  140    ?Potassium 3.5 - 5.1 mmol/L 4.3   3.6   4.5    ?Chloride 98 - 111 mmol/L 103   107   99    ?CO2 22 - 32 mmol/L '28   26   22    '$ ?Calcium 8.9 - 10.3 mg/dL 9.1   9.1   9.8    ? ? ?Lipid Panel  ?   ?Component Value Date/Time  ? CHOL 149 05/17/2015 1141  ? TRIG 37 05/17/2015 1141  ? HDL 65 05/17/2015 1141  ? CHOLHDL 2.3 05/17/2015 1141  ? LDLCALC 77 05/17/2015 1141  ? ? ?CBC ?   ?Component Value Date/Time  ? WBC 8.5 04/13/2019 0750  ? RBC 4.02 04/13/2019 0750  ? HGB 12.6 04/13/2019 0750  ? HCT 40.5 04/13/2019 0750  ? PLT 386 04/13/2019 0750  ? MCV 100.7 (H) 04/13/2019 0750  ? MCH 31.3 04/13/2019 0750  ? MCHC 31.1 04/13/2019 0750  ? RDW 15.0 04/13/2019 0750  ? LYMPHSABS 2.5 04/13/2019 0750  ? MONOABS 0.8 04/13/2019 0750  ? EOSABS 0.2 04/13/2019 0750  ? BASOSABS 0.0 04/13/2019 0750  ? ? ?No results found for: HGBA1C ? ?Assessment & Plan:  ?1. Other eczema ?Uncontrolled ?Counseled on skin care ?Will place on short course of oral prednisone ?Continue current topical treatment ?- Ambulatory referral to Dermatology ?- predniSONE (DELTASONE) 20 MG tablet; Take 1 tablet (20 mg total) by mouth daily with breakfast.  Dispense: 5 tablet; Refill: 0 ?- triamcinolone 0.1%-Aquaphor equivlanet 1:1 ointment mixture; Apply topically 2 (two) times daily.  Dispense: 240 g; Refill: 1 ?- hydrOXYzine (ATARAX) 25 MG tablet; Take 1 tablet (25 mg total) by mouth every 8 (eight) hours as needed for itching.  Dispense: 30 tablet; Refill: 1 ?- betamethasone dipropionate (DIPROLENE) 0.05 % ointment; Apply topically 2 (two) times daily.  Dispense: 45 g; Refill: 1 ? ?2. Fibroids, intramural ?Could explain her menorrhagia ?- naproxen (NAPROSYN) 500 MG tablet; Take 1 tablet (500 mg total) by mouth 2 (two) times daily as needed for mild  pain.  Dispense: 60 tablet; Refill: 0 ?- Ambulatory referral to Gynecology ?- CBC with Differential/Platelet ? ?3. Screening for colon cancer ?- Ambulatory referral to Gastroenterology ? ?4. Screening fo

## 2021-08-14 ENCOUNTER — Ambulatory Visit: Payer: Self-pay

## 2021-08-14 ENCOUNTER — Emergency Department (HOSPITAL_COMMUNITY)
Admission: EM | Admit: 2021-08-14 | Discharge: 2021-08-14 | Disposition: A | Discharge status: Home / Self Care | Payer: Medicaid Other | Attending: Emergency Medicine | Admitting: Emergency Medicine

## 2021-08-14 ENCOUNTER — Encounter (HOSPITAL_COMMUNITY): Payer: Self-pay

## 2021-08-14 ENCOUNTER — Other Ambulatory Visit: Payer: Self-pay

## 2021-08-14 DIAGNOSIS — Z79899 Other long term (current) drug therapy: Secondary | ICD-10-CM | POA: Insufficient documentation

## 2021-08-14 DIAGNOSIS — D500 Iron deficiency anemia secondary to blood loss (chronic): Secondary | ICD-10-CM

## 2021-08-14 DIAGNOSIS — N938 Other specified abnormal uterine and vaginal bleeding: Secondary | ICD-10-CM | POA: Insufficient documentation

## 2021-08-14 DIAGNOSIS — D509 Iron deficiency anemia, unspecified: Secondary | ICD-10-CM | POA: Insufficient documentation

## 2021-08-14 LAB — CBC
HCT: 25.9 % — ABNORMAL LOW (ref 36.0–46.0)
Hemoglobin: 7.1 g/dL — ABNORMAL LOW (ref 12.0–15.0)
MCH: 19.4 pg — ABNORMAL LOW (ref 26.0–34.0)
MCHC: 27.4 g/dL — ABNORMAL LOW (ref 30.0–36.0)
MCV: 70.8 fL — ABNORMAL LOW (ref 80.0–100.0)
Platelets: 456 10*3/uL — ABNORMAL HIGH (ref 150–400)
RBC: 3.66 MIL/uL — ABNORMAL LOW (ref 3.87–5.11)
RDW: 20.6 % — ABNORMAL HIGH (ref 11.5–15.5)
WBC: 14.5 10*3/uL — ABNORMAL HIGH (ref 4.0–10.5)
nRBC: 0 % (ref 0.0–0.2)

## 2021-08-14 LAB — COMPREHENSIVE METABOLIC PANEL
ALT: 7 U/L (ref 0–44)
AST: 13 U/L — ABNORMAL LOW (ref 15–41)
Albumin: 4.2 g/dL (ref 3.5–5.0)
Alkaline Phosphatase: 51 U/L (ref 38–126)
Anion gap: 6 (ref 5–15)
BUN: 10 mg/dL (ref 6–20)
CO2: 26 mmol/L (ref 22–32)
Calcium: 9.3 mg/dL (ref 8.9–10.3)
Chloride: 108 mmol/L (ref 98–111)
Creatinine, Ser: 0.69 mg/dL (ref 0.44–1.00)
GFR, Estimated: >60 mL/min (ref >60)
Glucose, Bld: 101 mg/dL — ABNORMAL HIGH (ref 70–99)
Potassium: 3.4 mmol/L — ABNORMAL LOW (ref 3.5–5.1)
Sodium: 140 mmol/L (ref 135–145)
Total Bilirubin: 0.1 mg/dL — ABNORMAL LOW (ref 0.3–1.2)
Total Protein: 7.9 g/dL (ref 6.5–8.1)

## 2021-08-14 LAB — CMP14+EGFR
ALT: 7 IU/L (ref 0–32)
AST: 12 IU/L (ref 0–40)
Albumin/Globulin Ratio: 1.6 (ref 1.2–2.2)
Albumin: 4.2 g/dL (ref 3.8–4.8)
Alkaline Phosphatase: 60 IU/L (ref 44–121)
BUN/Creatinine Ratio: 12 (ref 9–23)
BUN: 8 mg/dL (ref 6–24)
Bilirubin Total: <0.2 mg/dL (ref 0.0–1.2)
CO2: 21 mmol/L (ref 20–29)
Calcium: 9.3 mg/dL (ref 8.7–10.2)
Chloride: 107 mmol/L — ABNORMAL HIGH (ref 96–106)
Creatinine, Ser: 0.67 mg/dL (ref 0.57–1.00)
Globulin, Total: 2.7 g/dL (ref 1.5–4.5)
Glucose: 84 mg/dL (ref 70–99)
Potassium: 3.9 mmol/L (ref 3.5–5.2)
Sodium: 144 mmol/L (ref 134–144)
Total Protein: 6.9 g/dL (ref 6.0–8.5)
eGFR: 107 mL/min/{1.73_m2} (ref >59)

## 2021-08-14 LAB — CBC WITH DIFFERENTIAL/PLATELET
Basophils Absolute: 0.1 10*3/uL (ref 0.0–0.2)
Basos: 1 %
EOS (ABSOLUTE): 0.3 10*3/uL (ref 0.0–0.4)
Eos: 3 %
Hematocrit: 23.8 % — ABNORMAL LOW (ref 34.0–46.6)
Hemoglobin: 6.4 g/dL — CL (ref 11.1–15.9)
Immature Grans (Abs): 0 10*3/uL (ref 0.0–0.1)
Immature Granulocytes: 0 %
Lymphocytes Absolute: 2.6 10*3/uL (ref 0.7–3.1)
Lymphs: 28 %
MCH: 19 pg — ABNORMAL LOW (ref 26.6–33.0)
MCHC: 26.9 g/dL — ABNORMAL LOW (ref 31.5–35.7)
MCV: 71 fL — ABNORMAL LOW (ref 79–97)
Monocytes Absolute: 1.2 10*3/uL — ABNORMAL HIGH (ref 0.1–0.9)
Monocytes: 13 %
Neutrophils Absolute: 5.2 10*3/uL (ref 1.4–7.0)
Neutrophils: 55 %
Platelets: 474 10*3/uL — ABNORMAL HIGH (ref 150–450)
RBC: 3.36 x10E6/uL — ABNORMAL LOW (ref 3.77–5.28)
RDW: 18 % — ABNORMAL HIGH (ref 11.7–15.4)
WBC: 9.5 10*3/uL (ref 3.4–10.8)

## 2021-08-14 LAB — HCV AB W REFLEX TO QUANT PCR: HCV Ab: NONREACTIVE

## 2021-08-14 LAB — TYPE AND SCREEN
ABO/RH(D): A POS
Antibody Screen: NEGATIVE

## 2021-08-14 LAB — I-STAT BETA HCG BLOOD, ED (MC, WL, AP ONLY): I-stat hCG, quantitative: <5 m[IU]/mL (ref <5)

## 2021-08-14 LAB — HCV INTERPRETATION

## 2021-08-14 MED ORDER — FERROUS SULFATE 325 (65 FE) MG PO TABS: 325.0000 mg | ORAL_TABLET | Freq: Every day | ORAL | 3 refills | Status: DC

## 2021-08-14 NOTE — Telephone Encounter (Signed)
?  Chief Complaint: Pt wants a pill to take instead of transfusion. ?Symptoms:  ?Frequency:  ?Pertinent Negatives: Patient denies  ?Disposition: '[x]'$ ED /'[]'$ Urgent Care (no appt availability in office) / '[]'$ Appointment(In office/virtual)/ '[]'$  [facility]/ '[]'$ Home Care/ '[]'$ Refused Recommended Disposition /'[]'$ [facility]/ '[]'$  Follow-up with PCP ?Additional Notes: Per Dr. Smitty Pluck note: ?  ?Gomez Cleverly, CMA  ?08/14/2021 12:27 PM EDT Back to Top  ?  ?Patient name and DOB has been verified ?Patient was informed of lab results. ?Patient had no questions.  ? Gomez Cleverly, CMA  ?08/14/2021 11:57 AM EDT   ?  ?Lvm for patient to return phone call. ?  ?Ok to give results when patient returns call.  ? Charlott Rakes, MD  ?08/14/2021 11:52 AM EDT   ?  ?Please inform her that she is severely anemic likely due to her heavy periods.  She will need transfusion and needs to go to the ED to receive this.  Other labs are stable.  ? ? ?Restated that Dr. Margarita Rana stated that she needs to go to ED for transfusion. Pt will go ? ? ? ?Reason for Disposition ? Health Information question, no triage required and triager able to answer question ? ?Answer Assessment - Initial Assessment Questions ?1. REASON FOR CALL or QUESTION: "What is your reason for calling today?" or "How can I best help you?" or "What question do you have that I can help answer?" ?    Pt is wondering if she can take a pill rather than go for transfusion. ? ?Protocols used: Information Only Call - No Triage-A-AH ? ?

## 2021-08-14 NOTE — Discharge Instructions (Addendum)
You were seen in the emergency department for a low red blood cell count.  Your blood cell count was not critical and you did not need a transfusion at this time.  This is likely related to your heavy vaginal bleeding.  We will be important for you to follow-up with hematology and gynecology.  Please contact your primary care doctor for these referrals.  We are starting you on some iron supplements and you can also take a multivitamin.  Return to the emergency department if any worsening or concerning symptoms. ?

## 2021-08-14 NOTE — ED Triage Notes (Signed)
Patient was informed to come to the ED for a Hgb-6.4. ? ?Patient states she has had intermittent "bright red blood in her stool x 3 months or so." ?Patient also reports that she has heavy menstrual cycles. ?

## 2021-08-14 NOTE — Telephone Encounter (Signed)
As of now, looks as if patient is in the hospital. Orders for Type and Screen were place at 1540.  ?

## 2021-08-14 NOTE — Telephone Encounter (Signed)
I have placed a referral to hematology for evaluation for iron infusions however I am not sure how long it would take to get an appointment.  If she feels lightheaded or dizzy she needs to go to the ED. ?

## 2021-08-14 NOTE — ED Provider Notes (Signed)
?[facility] DEPT ?Provider Note ? ? ?CSN: [id number] ?Arrival date & time: 08/14/21  1528 ? ?  ? ?History ? ?Chief Complaint  ?Patient presents with  ? Abnormal Lab  ? Blood In Stools  ? ? ?Lori Perkins is a 51 y.o. female.  She has no significant past medical history other than eczema.  She saw her PCP yesterday who did routine blood work and found her hemoglobin to be 6.4.  They talked to her today and recommended that she come to the emergency department to get a transfusion.  She thinks her anemia is likely due to her heavy menstrual periods that have been worse for the last few years.  She does endorse maybe she feels a little bit tired but it does not limit her at all and there is no shortness of breath or chest pain.  She seen a little bit of blood when she wipes after a bowel movement but she attributes that to hemorrhoids.  She knows her iron is low and does not take any supplements.  She has never required a blood transfusion and has not had a colonoscopy. ? ?The history is provided by the patient.  ?Abnormal Lab ?Time since result:  1 day ?Patient referred by:  PCP ?Resulting agency:  [facility] ?Result type: hematology   ?Hematology:  ?  Hematocrit:  Low ?  Hemoglobin:  Low ? ?  ? ?Home Medications ?Prior to Admission medications   ?Medication Sig Start Date End Date Taking? Authorizing Provider  ?acetaminophen (TYLENOL) 500 MG tablet Take 1,000 mg by mouth every 6 (six) hours as needed for mild pain.     [provider]  ?betamethasone dipropionate (DIPROLENE) 0.05 % ointment Apply topically 2 (two) times daily. 08/13/21   Charlott Rakes, MD  ?hydrOXYzine (ATARAX) 25 MG tablet Take 1 tablet (25 mg total) by mouth every 8 (eight) hours as needed for itching. 08/13/21   Charlott Rakes, MD  ?naproxen (NAPROSYN) 500 MG tablet Take 1 tablet (500 mg total) by mouth 2 (two) times daily as needed for mild pain. 08/13/21   Charlott Rakes, MD  ?olopatadine (PATANOL) 0.1 %  ophthalmic solution Place 1 drop into both eyes 2 (two) times daily. 07/18/20   Wieters, Hallie C, PA-C  ?predniSONE (DELTASONE) 20 MG tablet Take 1 tablet (20 mg total) by mouth daily with breakfast. 08/13/21   Charlott Rakes, MD  ?triamcinolone 0.1%-Aquaphor equivlanet 1:1 ointment mixture Apply topically 2 (two) times daily. 08/13/21   Charlott Rakes, MD  ?diphenhydrAMINE (BENADRYL) 25 MG tablet Take 1 tablet (25 mg total) by mouth every 6 (six) hours as needed. 09/12/19 02/06/20  Hall-Potvin, [place], PA-C  ?   ? ?Allergies    ?Penicillins   ? ?Review of Systems   ?Review of Systems  ?Constitutional:  Negative for fever.  ?HENT:  Negative for sore throat.   ?Eyes:  Negative for visual disturbance.  ?Respiratory:  Negative for shortness of breath.   ?Cardiovascular:  Negative for chest pain.  ?Gastrointestinal:  Negative for abdominal pain.  ?Genitourinary:  Negative for dysuria.  ?Skin:  Positive for rash.  ?Neurological:  Negative for headaches.  ? ?Physical Exam ?Updated Vital Signs ?BP 133/89 (BP Location: Left Arm)   Pulse (!) 105   Temp 98.6 ?F (37 ?C) (Oral)   Resp 16   Ht '5\' 7"'$  (1.702 m)   Wt 54.1 kg   LMP 08/10/2021 (Approximate)   SpO2 100%   BMI 18.67 kg/m?  ?Physical Exam ?Vitals  and nursing note reviewed.  ?Constitutional:   ?   General: She is not in acute distress. ?   Appearance: Normal appearance. She is well-developed.  ?HENT:  ?   Head: Normocephalic and atraumatic.  ?Eyes:  ?   Conjunctiva/sclera: Conjunctivae normal.  ?Cardiovascular:  ?   Rate and Rhythm: Normal rate and regular rhythm.  ?   Heart sounds: No murmur heard. ?Pulmonary:  ?   Effort: Pulmonary effort is normal. No respiratory distress.  ?   Breath sounds: Normal breath sounds.  ?Abdominal:  ?   Palpations: Abdomen is soft.  ?   Tenderness: There is no abdominal tenderness. There is no guarding or rebound.  ?Musculoskeletal:     ?   General: No swelling.  ?   Cervical back: Neck supple.  ?   Right lower leg: No edema.  ?    Left lower leg: No edema.  ?Skin: ?   General: Skin is warm and dry.  ?   Capillary Refill: Capillary refill takes less than 2 seconds.  ?Neurological:  ?   General: No focal deficit present.  ?   Mental Status: She is alert.  ? ? ?ED Results / Procedures / Treatments   ?Labs ?(all labs ordered are listed, but only abnormal results are displayed) ?Labs Reviewed  ?COMPREHENSIVE METABOLIC PANEL - Abnormal; Notable for the following components:  ?    Result Value  ? Potassium 3.4 (*)   ? Glucose, Bld 101 (*)   ? AST 13 (*)   ? Total Bilirubin 0.1 (*)   ? All other components within normal limits  ?CBC - Abnormal; Notable for the following components:  ? WBC 14.5 (*)   ? RBC 3.66 (*)   ? Hemoglobin 7.1 (*)   ? HCT 25.9 (*)   ? MCV 70.8 (*)   ? MCH 19.4 (*)   ? MCHC 27.4 (*)   ? RDW 20.6 (*)   ? Platelets 456 (*)   ? All other components within normal limits  ?I-STAT BETA HCG BLOOD, ED ([facility], [facility], [facility] ONLY)  ?TYPE AND SCREEN  ? ? ?EKG ?None ? ?Radiology ?No results found. ? ?Procedures ?Procedures  ? ? ?Medications Ordered in ED ?Medications - No data to display ? ?ED Course/ Medical Decision Making/ A&P ?Clinical Course as of 08/15/21 0952  ?Thu Aug 14, 2021  ?1656 Patient's hemoglobin slightly better at 7.1 from the office test yesterday.  She is not having any significant symptoms.  She is also reticent to get a transfusion.  Feel she can start with the least iron supplement a multivitamin and follow-up with PCP, referral to gynecology and hematology.  Return instructions discussed [MB]  ?  ?Clinical Course User Index ?[MB] Hayden Rasmussen, MD  ? ?                        ?Medical Decision Making ?Amount and/or Complexity of Data Reviewed ?Labs: ordered. ? ?Risk ?OTC drugs. ? ?This patient complains of low hemoglobin, heavy vaginal bleeding; this involves an extensive number of treatment ?Options and is a complaint that carries with it a high risk of complications and ?morbidity. The differential includes anemia,  symptomatic anemia, GI bleed, vaginal bleeding, dysfunctional uterine bleeding ? ?I ordered, reviewed and interpreted labs, which included CBC with elevated white count, hemoglobin low improved from priors, chemistries with mildly low potassium, pregnancy test negative ?Additional history obtained from patient's mother ?Previous records obtained and reviewed  in epic including prior PCP visits ?Social determinants considered, no significant barriers ?Critical Interventions: None ? ?After the interventions stated above, I reevaluated the patient and found patient to be asymptomatic ?Admission and further testing considered, no indications for admission or further testing at this time.  Patient with borderline hemoglobin but really no significant symptoms.  Will trial with p.o. iron.  Recommended patient to get referrals for hematology and gynecology.  She understands she may end up ultimately needing transfusion but she is not interested in transfusion at this point. ? ? ? ? ? ? ? ? ? ?Final Clinical Impression(s) / ED Diagnoses ?Final diagnoses:  ?Iron deficiency anemia due to chronic blood loss  ?DUB (dysfunctional uterine bleeding)  ? ? ?Rx / DC Orders ?ED Discharge Orders   ? ?      Ordered  ?  ferrous sulfate 325 (65 FE) MG tablet  Daily       ? 08/14/21 1701  ? ?  ?  ? ?  ? ? ?  ?Hayden Rasmussen, MD ?08/15/21 [phone] ? ?

## 2021-08-14 NOTE — Telephone Encounter (Signed)
Pt called, spoke with her about transfusion. Pt states she isn't comfortable with getting the blood transfusion right now and still wants to think about it but would rather do another alterative. Pt was advised that with her levels being severely low I didn't know of an alternative that could help increase levels back to normal. Pt discussed her fears of having transfusion and her biggest thing is that she doesn't know who's blood she is getting and what they have. I spoke with her on how the hospital's filter and clean the blood prior to tranfusion and that mostly totally safe and has changed a lot of the older days. Pt still needed time to process and I told her I would send to Dr. Margarita Rana and she can follow up if theres other options.  ? ?Summary: pt told to go to ED for transfusion  ? Pt stated she was told to go to the ED for a transfusion but she does not want to go and requests call back from a nurse. Cb# [phone]   ?  ? ?Reason for Disposition ? [1] Caller requesting NON-URGENT health information AND [2] PCP's office is the best resource ? ?Answer Assessment - Initial Assessment Questions ?1. REASON FOR CALL or QUESTION: "What is your reason for calling today?" or "How can I best help you?" or "What question do you have that I can help answer?" ?    Pt is still unsure of getting a blood transfusion and wanting to know if there's another alternative ? ?Protocols used: Information Only Call - No Triage-A-AH ? ?

## 2021-08-14 NOTE — Addendum Note (Signed)
Addended byCharlott Rakes on: 08/14/2021 02:45 PM ? ? Modules accepted: Orders ? ?

## 2021-08-15 ENCOUNTER — Telehealth: Payer: Self-pay | Admitting: Internal Medicine

## 2021-08-15 NOTE — Telephone Encounter (Signed)
Scheduled appt per 4/6 referral. Pt is aware of appt date and time. Pt is aware to arrive 15 mins prior to appt time and to bring and updated insurance card. Pt is aware of appt location.   ?

## 2021-08-15 NOTE — Telephone Encounter (Signed)
Pt called in, she states that [facility] told her they are unable to fill the triamcinolone 0.1%-Aquaphor equivlanet 1:1 ointment mixture. She is asking if the rx can be sent in for just triamcinolone cream since she has vaseline at home. Pt also wanted to let Dr. Margarita Rana know she went to ED and they didn't do transfusion d/t her hgb increased to 7.1 and was asymptomatic. She has to go pick up the rx for Iron and already has appt scheduled for hematology.  ?

## 2021-08-15 NOTE — Telephone Encounter (Signed)
Pt called, advised her of [place]lin's message below. Pt verbalized understanding and had no further questions.  ? ?She already has Betamethasone which is a steroid so we do not need to write a separate Triamcinolone Prescription again. She can mix a small amount of Behamethasone  with her vaseline for her entire body surface and use the Betamethasone cream alone for the areas where the rash is concentrated. ?

## 2021-08-15 NOTE — Telephone Encounter (Signed)
She already has Betamethasone which is a steroid so we do not need to write a separate Triamcinolone Prescription again. She can mix a small amount of Behamethasone  with her vaseline for her entire body surface and use the Betamethasone cream alone for the areas where the rash is concentrated. ?

## 2021-08-20 ENCOUNTER — Ambulatory Visit: Payer: Self-pay | Admitting: *Deleted

## 2021-08-20 NOTE — Telephone Encounter (Signed)
Summary: advice - medications  ? Pt requested to speak with a nurse regarding her medication - ferrous sulfate 325 (65 FE) MG tablet, pt was seen in the ED 4/6 and had some follow up questions regarding this Rx and predniSONE, please advise.   ?  ? ?Reason for Disposition ? Caller has medicine question only, adult not sick, AND triager answers question ? ?Answer Assessment - Initial Assessment Questions ?1. NAME of MEDICATION: "What medicine are you calling about?" ?    Questions about Iron and prednisone ?2. QUESTION: "What is your question?" (e.g., double dose of medicine, side effect) ?    Do I just take one iron supplement daily- yes- directions are one tablet daily ?    Why did I just get 5 prednisone pills and do I have RF- to try to decrease inflammation from eczema and no RF on that Rx ?3. PRESCRIBING HCP: "Who prescribed it?" Reason: if prescribed by specialist, call should be referred to that group. ?    ED/PCP ?Patient also advised of appointment with hematology. She wanted to know when her labs would be checked again- advised her she is scheduled for labs that day as well.  ?Patient was advised follow up with PCP- advised no appointment available- will send note to office for scheduling follow up ED. ? ?Protocols used: Medication Question Call-A-AH ? ?

## 2021-08-20 NOTE — Telephone Encounter (Signed)
PCP out of the office.  ?Patient has an apt with Dr. Julien Nordmann on 08/26/2021.  ?Will schedule a virtual/ MyChart apt with PCP on 09/01/2021  ?

## 2021-08-25 ENCOUNTER — Other Ambulatory Visit: Payer: Self-pay | Admitting: Internal Medicine

## 2021-08-25 DIAGNOSIS — D539 Nutritional anemia, unspecified: Secondary | ICD-10-CM

## 2021-08-26 ENCOUNTER — Encounter: Payer: Self-pay | Admitting: Internal Medicine

## 2021-08-26 ENCOUNTER — Inpatient Hospital Stay: Payer: Medicaid Other | Attending: Internal Medicine | Admitting: Internal Medicine

## 2021-08-26 ENCOUNTER — Inpatient Hospital Stay: Payer: Medicaid Other

## 2021-08-26 ENCOUNTER — Other Ambulatory Visit: Payer: Self-pay

## 2021-08-26 VITALS — BP 128/85 | HR 102 | Temp 98.4°F | Resp 18 | Ht 67.0 in | Wt 119.7 lb

## 2021-08-26 DIAGNOSIS — R5383 Other fatigue: Secondary | ICD-10-CM | POA: Insufficient documentation

## 2021-08-26 DIAGNOSIS — F17210 Nicotine dependence, cigarettes, uncomplicated: Secondary | ICD-10-CM | POA: Insufficient documentation

## 2021-08-26 DIAGNOSIS — Z880 Allergy status to penicillin: Secondary | ICD-10-CM | POA: Insufficient documentation

## 2021-08-26 DIAGNOSIS — D539 Nutritional anemia, unspecified: Secondary | ICD-10-CM

## 2021-08-26 DIAGNOSIS — Z823 Family history of stroke: Secondary | ICD-10-CM | POA: Insufficient documentation

## 2021-08-26 DIAGNOSIS — Z803 Family history of malignant neoplasm of breast: Secondary | ICD-10-CM | POA: Insufficient documentation

## 2021-08-26 DIAGNOSIS — Z8249 Family history of ischemic heart disease and other diseases of the circulatory system: Secondary | ICD-10-CM | POA: Insufficient documentation

## 2021-08-26 DIAGNOSIS — N920 Excessive and frequent menstruation with regular cycle: Secondary | ICD-10-CM | POA: Insufficient documentation

## 2021-08-26 DIAGNOSIS — D500 Iron deficiency anemia secondary to blood loss (chronic): Secondary | ICD-10-CM | POA: Insufficient documentation

## 2021-08-26 DIAGNOSIS — Z86018 Personal history of other benign neoplasm: Secondary | ICD-10-CM | POA: Insufficient documentation

## 2021-08-26 DIAGNOSIS — K219 Gastro-esophageal reflux disease without esophagitis: Secondary | ICD-10-CM | POA: Insufficient documentation

## 2021-08-26 DIAGNOSIS — Z560 Unemployment, unspecified: Secondary | ICD-10-CM | POA: Insufficient documentation

## 2021-08-26 DIAGNOSIS — Z833 Family history of diabetes mellitus: Secondary | ICD-10-CM | POA: Insufficient documentation

## 2021-08-26 LAB — CBC WITH DIFFERENTIAL (CANCER CENTER ONLY)
Abs Immature Granulocytes: 0.17 10*3/uL — ABNORMAL HIGH (ref 0.00–0.07)
Basophils Absolute: 0.1 10*3/uL (ref 0.0–0.1)
Basophils Relative: 1 %
Eosinophils Absolute: 0.2 10*3/uL (ref 0.0–0.5)
Eosinophils Relative: 1 %
HCT: 28.9 % — ABNORMAL LOW (ref 36.0–46.0)
Hemoglobin: 8.1 g/dL — ABNORMAL LOW (ref 12.0–15.0)
Immature Granulocytes: 1 %
Lymphocytes Relative: 24 %
Lymphs Abs: 3 10*3/uL (ref 0.7–4.0)
MCH: 20.8 pg — ABNORMAL LOW (ref 26.0–34.0)
MCHC: 28 g/dL — ABNORMAL LOW (ref 30.0–36.0)
MCV: 74.1 fL — ABNORMAL LOW (ref 80.0–100.0)
Monocytes Absolute: 1.3 10*3/uL — ABNORMAL HIGH (ref 0.1–1.0)
Monocytes Relative: 11 %
Neutro Abs: 7.7 10*3/uL (ref 1.7–7.7)
Neutrophils Relative %: 62 %
Platelet Count: 566 10*3/uL — ABNORMAL HIGH (ref 150–400)
RBC: 3.9 MIL/uL (ref 3.87–5.11)
RDW: 27.1 % — ABNORMAL HIGH (ref 11.5–15.5)
WBC Count: 12.5 10*3/uL — ABNORMAL HIGH (ref 4.0–10.5)
nRBC: 0 % (ref 0.0–0.2)

## 2021-08-26 LAB — VITAMIN B12: Vitamin B-12: 370 pg/mL (ref 180–914)

## 2021-08-26 LAB — IRON AND IRON BINDING CAPACITY (CC-WL,HP ONLY)
Iron: 24 ug/dL — ABNORMAL LOW (ref 28–170)
Saturation Ratios: 5 % — ABNORMAL LOW (ref 10.4–31.8)
TIBC: 469 ug/dL — ABNORMAL HIGH (ref 250–450)
UIBC: 445 ug/dL — ABNORMAL HIGH (ref 148–442)

## 2021-08-26 LAB — CMP (CANCER CENTER ONLY)
ALT: 5 U/L (ref 0–44)
AST: 9 U/L — ABNORMAL LOW (ref 15–41)
Albumin: 4.3 g/dL (ref 3.5–5.0)
Alkaline Phosphatase: 49 U/L (ref 38–126)
Anion gap: 9 (ref 5–15)
BUN: 13 mg/dL (ref 6–20)
CO2: 23 mmol/L (ref 22–32)
Calcium: 9.3 mg/dL (ref 8.9–10.3)
Chloride: 106 mmol/L (ref 98–111)
Creatinine: 0.85 mg/dL (ref 0.44–1.00)
GFR, Estimated: >60 mL/min (ref >60)
Glucose, Bld: 109 mg/dL — ABNORMAL HIGH (ref 70–99)
Potassium: 3.7 mmol/L (ref 3.5–5.1)
Sodium: 138 mmol/L (ref 135–145)
Total Bilirubin: 0.3 mg/dL (ref 0.3–1.2)
Total Protein: 7.6 g/dL (ref 6.5–8.1)

## 2021-08-26 LAB — LACTATE DEHYDROGENASE: LDH: 136 U/L (ref 98–192)

## 2021-08-26 LAB — FERRITIN: Ferritin: 17 ng/mL (ref 11–307)

## 2021-08-26 LAB — FOLATE: Folate: 6.4 ng/mL (ref >5.9)

## 2021-08-26 MED ORDER — INTEGRA PLUS PO CAPS: 1.0000 | ORAL_CAPSULE | Freq: Every day | ORAL | 3 refills | Status: DC

## 2021-08-26 NOTE — Patient Instructions (Signed)
Steps to Quit Smoking Smoking tobacco is the leading cause of preventable death. It can affect almost every organ in the body. Smoking puts you and people around you at risk for many serious, long-lasting (chronic) diseases. Quitting smoking can be hard, but it is one of the best things that you can do for your health. It is never too late to quit. Do not give up if you cannot quit the first time. Some people need to try many times to quit. Do your best to stick to your quit plan, and talk with your doctor if you have any questions or concerns. How do I get ready to quit? Pick a date to quit. Set a date within the next 2 weeks to give you time to prepare. Write down the reasons why you are quitting. Keep this list in places where you will see it often. Tell your family, friends, and co-workers that you are quitting. Their support is important. Talk with your doctor about the choices that may help you quit. Find out if your health insurance will pay for these treatments. Know the people, places, things, and activities that make you want to smoke (triggers). Avoid them. What first steps can I take to quit smoking? Throw away all cigarettes at home, at work, and in your car. Throw away the things that you use when you smoke, such as ashtrays and lighters. Clean your car. Empty the ashtray. Clean your home, including curtains and carpets. What can I do to help me quit smoking? Talk with your doctor about taking medicines and seeing a counselor. You are more likely to succeed when you do both. If you are pregnant or breastfeeding: Talk with your doctor about counseling or other ways to quit smoking. Do not take medicine to help you quit smoking unless your doctor tells you to. Quit right away Quit smoking completely, instead of slowly cutting back on how much you smoke over a period of time. Stopping smoking right away may be more successful than slowly quitting. Go to counseling. In-person is best  if this is an option. You are more likely to quit if you go to counseling sessions regularly. Take medicine You may take medicines to help you quit. Some medicines need a prescription, and some you can buy over-the-counter. Some medicines may contain a drug called nicotine to replace the nicotine in cigarettes. Medicines may: Help you stop having the desire to smoke (cravings). Help to stop the problems that come when you stop smoking (withdrawal symptoms). Your doctor may ask you to use: Nicotine patches, gum, or lozenges. Nicotine inhalers or sprays. Non-nicotine medicine that you take by mouth. Find resources Find resources and other ways to help you quit smoking and remain smoke-free after you quit. They include: Online chats with a counselor. Phone quitlines. Printed self-help materials. Support groups or group counseling. Text messaging programs. Mobile phone apps. Use apps on your mobile phone or tablet that can help you stick to your quit plan. Examples of free services include Quit Guide from the CDC and smokefree.gov  What can I do to make it easier to quit?  Talk to your family and friends. Ask them to support and encourage you. Call a phone quitline, such as 1-800-QUIT-NOW, reach out to support groups, or work with a counselor. Ask people who smoke to not smoke around you. Avoid places that make you want to smoke, such as: Bars. Parties. Smoke-break areas at work. Spend time with people who do not smoke. Lower   the stress in your life. Stress can make you want to smoke. Try these things to lower stress: Getting regular exercise. Doing deep-breathing exercises. Doing yoga. Meditating. What benefits will I see if I quit smoking? Over time, you may have: A better sense of smell and taste. Less coughing and sore throat. A slower heart rate. Lower blood pressure. Clearer skin. Better breathing. Fewer sick days. Summary Quitting smoking can be hard, but it is one of  the best things that you can do for your health. Do not give up if you cannot quit the first time. Some people need to try many times to quit. When you decide to quit smoking, make a plan to help you succeed. Quit smoking right away, not slowly over a period of time. When you start quitting, get help and support to keep you smoke-free. This information is not intended to replace advice given to you by your health care provider. Make sure you discuss any questions you have with your health care provider. Document Revised: 04/18/2021 Document Reviewed: 04/18/2021 Elsevier Patient Education  2023 Elsevier Inc.  

## 2021-08-26 NOTE — Progress Notes (Signed)
? ? [place] ?[phone]   [phone] ? ?CONSULT NOTE ? ?REFERRING PHYSICIAN: Charlott Rakes, MD ? ?REASON FOR CONSULTATION:  ?51 years old African-American female with iron deficiency anemia. ? ?HPI ?Samuel Torin Whisner is a 51 y.o. female with past medical history significant for GERD, eczema, uterine fibroid as well as abnormal Pap smear and borderline hypertension.  The patient was seen by her primary care physician in early 2023 complaining of increasing fatigue and weakness.  She had repeat CBC on 08/13/2021 and it showed hemoglobin was down to 6.4 with hematocrit of 23.8% and MCV of 71.  Her platelets count were also elevated at 474,000.  two years ago the patient had normal hemoglobin and hematocrit.  The patient was started on ferrous sulfate 325 mg p.o. daily for the last 2 weeks.  She was referred to me today for evaluation and recommendation regarding her iron deficiency anemia.  When seen today she is feeling fine except for fatigue, lack of stamina as well as lightheadedness.  She continues to have heavy menstruation that lasted between 5 to 7 days with some clots.  She also drinks a lot of black tea.  She denied having any other bleeding, bruises or ecchymosis especially no melena but probably has some hemorrhoids.  She does not have any recent colonoscopy or upper endoscopy. ?On review of systems today she has no nausea, vomiting, diarrhea or constipation.  She has no chest pain, shortness of breath, cough or hemoptysis.  She denied having any weight loss or night sweats.  She has no headache or visual changes. ?Family history significant for father with hypertension and stroke.  Mother had diabetes mellitus.  Maternal aunt had breast cancer. ?The patient is single and has no children.  She is currently unemployed and she is studying to become a Education administrator.  She has a history of smoking less than 1 pack/day for around 24 years.  She drinks alcohol  occasionally and no history of drug abuse. ? ?HPI ? ?Past Medical History:  ?Diagnosis Date  ? GERD (gastroesophageal reflux disease)   ? History of abnormal cervical Pap smear 2013  ? 2013 Pap smear LSIL+, HPV not detected, 2014 Colposcopy with benign tissue   ? History of sexually transmitted disease 01/16/2014  ? BV, trichomonas  ? ? ?Past Surgical History:  ?Procedure Laterality Date  ? MULTIPLE TOOTH EXTRACTIONS    ? ? ?Family History  ?Problem Relation Age of Onset  ? Diabetes Mother   ? Hypertension Father   ? Stroke Father   ? Cancer Maternal Aunt   ? Breast cancer Maternal Aunt 74  ? Cancer Maternal Uncle   ? Diabetes Maternal Grandmother   ? Breast cancer Maternal Grandmother 2  ? ? ?Social History ?Social History  ? ?Tobacco Use  ? Smoking status: Every Day  ?  Packs/day: 0.50  ?  Years: 20.00  ?  Pack years: 10.00  ?  Types: Cigarettes  ? Smokeless tobacco: Never  ?Vaping Use  ? Vaping Use: Former  ?Substance Use Topics  ? Alcohol use: Yes  ?  Comment: occasionally  ? Drug use: Not Currently  ?  Types: Marijuana  ? ? ?Allergies  ?Allergen Reactions  ? Penicillins   ?  Has patient had a PCN reaction causing immediate rash, facial/tongue/throat swelling, SOB or lightheadedness with hypotension: Unknown ?Has patient had a PCN reaction causing severe rash involving mucus membranes or skin necrosis: Unknown ?Has patient had a PCN reaction  that required hospitalization: Unknown ?Has patient had a PCN reaction occurring within the last 10 years: No ?If all of the above answers are "NO", then may proceed with Cephalosporin use.  ? ? ?Current Outpatient Medications  ?Medication Sig Dispense Refill  ? acetaminophen (TYLENOL) 500 MG tablet Take 1,000 mg by mouth every 6 (six) hours as needed for mild pain.     ? betamethasone dipropionate (DIPROLENE) 0.05 % ointment Apply topically 2 (two) times daily. 45 g 1  ? ferrous sulfate 325 (65 FE) MG tablet Take 1 tablet (325 mg total) by mouth daily. 30 tablet 3  ?  hydrOXYzine (ATARAX) 25 MG tablet Take 1 tablet (25 mg total) by mouth every 8 (eight) hours as needed for itching. 30 tablet 1  ? naproxen (NAPROSYN) 500 MG tablet Take 1 tablet (500 mg total) by mouth 2 (two) times daily as needed for mild pain. 60 tablet 0  ? olopatadine (PATANOL) 0.1 % ophthalmic solution Place 1 drop into both eyes 2 (two) times daily. (Patient not taking: Reported on 08/14/2021) 5 mL 12  ? predniSONE (DELTASONE) 20 MG tablet Take 1 tablet (20 mg total) by mouth daily with breakfast. 5 tablet 0  ? triamcinolone 0.1%-Aquaphor equivlanet 1:1 ointment mixture Apply topically 2 (two) times daily. 240 g 1  ? ?No current facility-administered medications for this visit.  ? ? ?Review of Systems ? ?Constitutional: positive for fatigue ?Eyes: negative ?Ears, nose, mouth, throat, and face: negative ?Respiratory: negative ?Cardiovascular: negative ?Gastrointestinal: negative ?Genitourinary:negative ?Integument/breast: negative ?Hematologic/lymphatic: negative ?Musculoskeletal:negative ?Neurological: positive for dizziness ?Behavioral/Psych: negative ?Endocrine: negative ?Allergic/Immunologic: negative ? ?Physical Exam ? ?PRF:FMBWG, healthy, no distress, well nourished, and well developed ?SKIN: skin color, texture, turgor are normal, no rashes or significant lesions ?HEAD: Normocephalic, No masses, lesions, tenderness or abnormalities ?EYES: normal, PERRLA, Conjunctiva are pink and non-injected ?EARS: External ears normal, Canals clear ?OROPHARYNX:no exudate, no erythema, and lips, buccal mucosa, and tongue normal  ?NECK: supple, no adenopathy, no JVD ?LYMPH:  no palpable lymphadenopathy, no hepatosplenomegaly ?BREAST:not examined ?LUNGS: clear to auscultation , and palpation ?HEART: regular rate & rhythm, no murmurs, and no gallops ?ABDOMEN:abdomen soft, non-tender, normal bowel sounds, and no masses or organomegaly ?BACK: Back symmetric, no curvature., No CVA tenderness ?EXTREMITIES:no joint deformities,  effusion, or inflammation, no edema  ?NEURO: alert & oriented x 3 with fluent speech, no focal motor/sensory deficits ? ?PERFORMANCE STATUS: ECOG 1 ? ?LABORATORY DATA: ?Lab Results  ?Component Value Date  ? WBC 12.5 (H) 08/26/2021  ? HGB 8.1 (L) 08/26/2021  ? HCT 28.9 (L) 08/26/2021  ? MCV 74.1 (L) 08/26/2021  ? PLT 566 (H) 08/26/2021  ? ? ?  Chemistry   ?   ?Component Value Date/Time  ? NA 140 08/14/2021 1540  ? NA 144 08/13/2021 1651  ? K 3.4 (L) 08/14/2021 1540  ? CL 108 08/14/2021 1540  ? CO2 26 08/14/2021 1540  ? BUN 10 08/14/2021 1540  ? BUN 8 08/13/2021 1651  ? CREATININE 0.69 08/14/2021 1540  ?    ?Component Value Date/Time  ? CALCIUM 9.3 08/14/2021 1540  ? ALKPHOS 51 08/14/2021 1540  ? AST 13 (L) 08/14/2021 1540  ? ALT 7 08/14/2021 1540  ? BILITOT 0.1 (L) 08/14/2021 1540  ? BILITOT <0.2 08/13/2021 1651  ?  ? ? ? ?RADIOGRAPHIC STUDIES: ?No results found. ? ?ASSESSMENT: This is a very pleasant 51 years old African-American female with iron deficiency anemia secondary to menorrhagia with heavy menstrual cycles. ?Gastrointestinal blood loss could not be  ruled out at this point and the patient is at the age for routine colonoscopy. ? ?PLAN: I had a lengthy discussion with the patient today about her current condition and further investigation to identify the etiology of her anemia and also treatment options. ?Repeat CBC today showed some improvement in her hemoglobin up to 8.1 and hematocrit 28.9% with MCV of 74.1.  She continues to have elevated white blood count of 12.5 and platelet count 566,000.  Iron study showed low serum iron of 24 with iron saturation of 5% and TIBC of 469.  Ferritin level is still pending. ?I ordered several other studies including blood for heavy metals, serum folate, vitamin B12 level, serum protein electrophoresis. ?The patient may need referral by her primary care physician to gastroenterology for at least screening colonoscopy based on her age and the recent anemia. ?I discussed  with the patient her treatment options and I offered her iron infusion with Venofer versus continuation with oral iron tablet but I will switch her to Integra +1 capsule p.o. daily. ?The patient is not interested

## 2021-08-27 LAB — PROTEIN ELECTROPHORESIS, SERUM, WITH REFLEX
A/G Ratio: 1.2 (ref 0.7–1.7)
Albumin ELP: 3.7 g/dL (ref 2.9–4.4)
Alpha-1-Globulin: 0.3 g/dL (ref 0.0–0.4)
Alpha-2-Globulin: 0.9 g/dL (ref 0.4–1.0)
Beta Globulin: 1 g/dL (ref 0.7–1.3)
Gamma Globulin: 1 g/dL (ref 0.4–1.8)
Globulin, Total: 3.2 g/dL (ref 2.2–3.9)
Total Protein ELP: 6.9 g/dL (ref 6.0–8.5)

## 2021-08-28 ENCOUNTER — Telehealth: Payer: Self-pay | Admitting: Family Medicine

## 2021-08-28 LAB — HEAVY METALS, BLOOD
Arsenic: 1 ug/L (ref 0–9)
Lead: <1 ug/dL (ref 0.0–3.4)
Mercury: <1 ug/L (ref 0.0–14.9)

## 2021-08-28 NOTE — Telephone Encounter (Signed)
Patient was called and informed that all her questions can be answered once she does the telephone visit with PCP on 09/01/2021 at 8:10 ?

## 2021-08-28 NOTE — Telephone Encounter (Signed)
Copied from [place] [phone]. Topic: General - Other ?>> Aug 28, 2021 12:02 PM Tessa Lerner A wrote: ?Reason for CRM: The patient would like to speak with a member of clinical staff directly when possible ? ?The patient has updates regarding their appointment with Dr. Earlie Server that they would like to relay  ? ?Please contact further when possible ?

## 2021-09-01 ENCOUNTER — Ambulatory Visit: Payer: Self-pay

## 2021-09-01 ENCOUNTER — Telehealth: Payer: Self-pay | Admitting: Family Medicine

## 2021-09-01 ENCOUNTER — Ambulatory Visit: Payer: Medicaid Other | Attending: Family Medicine | Admitting: Family Medicine

## 2021-09-01 DIAGNOSIS — L308 Other specified dermatitis: Secondary | ICD-10-CM

## 2021-09-01 NOTE — Telephone Encounter (Signed)
Pt missed her appt with PCP this morning. Pt says that she is taking prednison and another medication (pt is unsure of the name) pt says that she would like to discuss further. Pt says that she has been experiencing some swelling in her hands that she would like to discuss further.  ? ?Pt. Asking for a refill on Prednisone and Atarax. Is it ok to take with Integra Plus? Has not heard back from GYN referral. Please advise pt. ?Answer Assessment - Initial Assessment Questions ?1. NAME of MEDICATION: "What medicine are you calling about?" ?    Prednisone 20 mg 1 pill daily ?2. QUESTION: "What is your question?" (e.g., double dose of medicine, side effect) ?    Can she have a refill ?3. PRESCRIBING HCP: "Who prescribed it?" Reason: if prescribed by specialist, call should be referred to that group. ?    Dr. Margarita Rana ?4. SYMPTOMS: "Do you have any symptoms?" ?    Itching and irritation to hands ?5. SEVERITY: If symptoms are present, ask "Are they mild, moderate or severe?" ?    Moderate-severe ?6. PREGNANCY:  "Is there any chance that you are pregnant?" "When was your last menstrual period?" ?    No ? ?Protocols used: Medication Question Call-A-AH ? ?

## 2021-09-01 NOTE — Telephone Encounter (Signed)
Pt is request a refill for her  ? ? ?Pharmacy: [facility] [address]  ? ? ?[phone]  ?

## 2021-09-01 NOTE — Telephone Encounter (Signed)
Advised patient to return call office to schedule an apt with PCP form medication concerns and refills.  ?

## 2021-09-02 NOTE — Telephone Encounter (Signed)
Called patient to review swelling in hands and medication requests.  ?Summary: medication questions/ hand swelling  ? Pt called again today to follow up on her request for prednisone / she was advised to schedule an appt and she has scheduled an appt for 5.17.23/ pt stated her swelling of the hand is increasing / please advise  ? ?----- Message from Nani Ravens sent at 09/01/2021 10:07 AM EDT -----  ?Pt missed her appt with PCP this morning. Pt says that she is taking prednison and another medication (pt is unsure of the name) pt says that she would like to discuss further. Pt says that she has been experiencing some swelling in her hands that she would like to discuss further.   ?  ? ? ?Chief Complaint: bilateral hands/ fingers swelling , requesting medication ?Symptoms: hands and fingers swollen. Skin "looks like eczema".  ?Frequency: greater than a month  ?Pertinent Negatives: Patient denies fever, difficulty breathing.  ?Disposition: '[]'$ ED /'[x]'$ Urgent Care (no appt availability in office) / '[]'$ Appointment(In office/virtual)/ '[]'$  [place] [place] [facility]/ '[]'$ Home Care/ '[x]'$ Refused Recommended Disposition /'[]'$ [place] [facility]/ '[]'$  Follow-up with PCP ?Additional Notes:  ? ?Patient reports she does not want to go to UC or UC VV. Patient has been seen in UC more than once and given prednisone before, patient would like to see PCP and allow PCP to see her skin. Requesting if prednisone will be given prior to appt 09/24/21 or call patient if earlier appt can be scheduled. Please advise .  ? ? ?Reason for Disposition ?? [1] MILD swelling (puffiness) of both hands AND [2] new-onset or worsening  (Exception: caused by hot weather or normal pregnancy swelling) ? ?Answer Assessment - Initial Assessment Questions ?1. ONSET: "When did the swelling start?" (e.g., minutes, hours, days) ?   Greater than a month  ?2. LOCATION: "What part of the hand is swollen?"  "Are both hands swollen or just one hand?" ?    Hand and  fingers ?3. SEVERITY: "How bad is the swelling?" (e.g., localized; mild, moderate, severe) ?  - BALL OR LUMP: small ball or lump ?  - LOCALIZED: puffy or swollen area or patch of skin ?  - JOINT SWELLING: swelling of a joint ?  - MILD: puffiness or mild swelling of fingers or hand ?  - MODERATE: fingers and hand are swollen ?  - SEVERE: swelling of entire hand and up into forearm ?    moderate ?4. REDNESS: "Does the swelling look red or infected?" ?    "Looks like eczema" per patient  ?5. PAIN: "Is the swelling painful to touch?" If Yes, ask: "How painful is it?"   (Scale 1-10; mild, moderate or severe) ?    10 ?6. FEVER: "Do you have a fever?" If Yes, ask: "What is it, how was it measured, and when did it start?"  ?    No  ?7. CAUSE: "What do you think is causing the hand swelling?" (e.g., heat, insect bite, pregnancy, recent injury) ?    Not sure maybe eczema  ?8. MEDICAL HISTORY: "Do you have a history of heart failure, kidney disease, liver failure, or cancer?" ?    na ?9. RECURRENT SYMPTOM: "Have you had hand swelling before?" If Yes, ask: "When was the last time?" "What happened that time?" ?    na ?10. OTHER SYMPTOMS: "Do you have any other symptoms?" (e.g., blurred vision, difficulty breathing, headache) ?      Denies  ?11. PREGNANCY: "Is there  any chance you are pregnant?" "When was your last menstrual period?" ?      na ? ?Protocols used: Hand Swelling-A-AH ? ?

## 2021-09-02 NOTE — Telephone Encounter (Signed)
Pt called about getting prednisone and stated that she has already scheduled an appt for 5.17.23/ please advise asap / pt stated she still has swelling of the hand  ?

## 2021-09-03 MED ORDER — PREDNISONE 20 MG PO TABS: 20.0000 mg | ORAL_TABLET | Freq: Every day | ORAL | 0 refills | Status: DC

## 2021-09-03 NOTE — Telephone Encounter (Signed)
Refill sent for prednisone.  ?

## 2021-09-24 ENCOUNTER — Ambulatory Visit: Payer: Self-pay | Attending: Family Medicine | Admitting: Family Medicine

## 2021-09-24 ENCOUNTER — Encounter: Payer: Self-pay | Admitting: Family Medicine

## 2021-09-24 VITALS — BP 118/73 | HR 95 | Ht 67.0 in | Wt 126.4 lb

## 2021-09-24 DIAGNOSIS — L309 Dermatitis, unspecified: Secondary | ICD-10-CM | POA: Insufficient documentation

## 2021-09-24 DIAGNOSIS — L308 Other specified dermatitis: Secondary | ICD-10-CM

## 2021-09-24 DIAGNOSIS — D251 Intramural leiomyoma of uterus: Secondary | ICD-10-CM | POA: Insufficient documentation

## 2021-09-24 DIAGNOSIS — D500 Iron deficiency anemia secondary to blood loss (chronic): Secondary | ICD-10-CM | POA: Insufficient documentation

## 2021-09-24 DIAGNOSIS — D649 Anemia, unspecified: Secondary | ICD-10-CM | POA: Insufficient documentation

## 2021-09-24 DIAGNOSIS — K5903 Drug induced constipation: Secondary | ICD-10-CM | POA: Insufficient documentation

## 2021-09-24 DIAGNOSIS — Z7952 Long term (current) use of systemic steroids: Secondary | ICD-10-CM | POA: Insufficient documentation

## 2021-09-24 DIAGNOSIS — Z760 Encounter for issue of repeat prescription: Secondary | ICD-10-CM | POA: Insufficient documentation

## 2021-09-24 MED ORDER — PREDNISONE 20 MG PO TABS: 20.0000 mg | ORAL_TABLET | Freq: Every day | ORAL | 0 refills | Status: DC

## 2021-09-24 MED ORDER — TRIAMCINOLONE ACETONIDE 0.1 % EX CREA: 1.0000 "application " | TOPICAL_CREAM | Freq: Two times a day (BID) | CUTANEOUS | 0 refills | Status: DC

## 2021-09-24 MED ORDER — POLYETHYLENE GLYCOL 3350 17 GM/SCOOP PO POWD: 17.0000 g | Freq: Every day | ORAL | 1 refills | Status: AC

## 2021-09-24 NOTE — Progress Notes (Signed)
Refill on prednisone ?Iron pill causing constipation. ?

## 2021-09-24 NOTE — Patient Instructions (Addendum)
[facility] [address] [place], [place] [address] ?PH# [phone] ? ?[facility]   ?[place], [place] [address] ?Ph# [phone] ?  ? Constipation, Adult ?Constipation is when a person has fewer than three bowel movements in a week, has difficulty having a bowel movement, or has stools (feces) that are dry, hard, or larger than normal. Constipation may be caused by an underlying condition. It may become worse with age if a person takes certain medicines and does not take in enough fluids. ?Follow these instructions at home: ?Eating and drinking ? ?Eat foods that have a lot of fiber, such as beans, whole grains, and fresh fruits and vegetables. ?Limit foods that are low in fiber and high in fat and processed sugars, such as fried or sweet foods. These include french fries, hamburgers, cookies, candies, and soda. ?Drink enough fluid to keep your urine pale yellow. ?General instructions ?Exercise regularly or as told by your health care provider. Try to do 150 minutes of moderate exercise each week. ?Use the bathroom when you have the urge to go. Do not hold it in. ?Take over-the-counter and prescription medicines only as told by your health care provider. This includes any fiber supplements. ?During bowel movements: ?Practice deep breathing while relaxing the lower abdomen. ?Practice pelvic floor relaxation. ?Watch your condition for any changes. Let your health care provider know about them. ?Keep all follow-up visits as told by your health care provider. This is important. ?Contact a health care provider if: ?You have pain that gets worse. ?You have a fever. ?You do not have a bowel movement after 4 days. ?You vomit. ?You are not hungry or you lose weight. ?You are bleeding from the opening between the buttocks (anus). ?You have thin, pencil-like stools. ?Get help right away if: ?You have a fever and your symptoms suddenly get worse. ?You leak stool or have blood in your stool. ?Your abdomen is bloated. ?You  have severe pain in your abdomen. ?You feel dizzy or you faint. ?Summary ?Constipation is when a person has fewer than three bowel movements in a week, has difficulty having a bowel movement, or has stools (feces) that are dry, hard, or larger than normal. ?Eat foods that have a lot of fiber, such as beans, whole grains, and fresh fruits and vegetables. ?Drink enough fluid to keep your urine pale yellow. ?Take over-the-counter and prescription medicines only as told by your health care provider. This includes any fiber supplements. ?This information is not intended to replace advice given to you by your health care provider. Make sure you discuss any questions you have with your health care provider. ?Document Revised: 03/15/2019 Document Reviewed: 03/15/2019 ?Elsevier Patient Education ? [place]. ? ?

## 2021-09-24 NOTE — Progress Notes (Signed)
? ?Subjective:  ?Patient ID: Lori Perkins, female    DOB: Jan 25, 1971  Age: 51 y.o. MRN: [id number] ? ?CC: Hospitalization Follow-up ? ? ?HPI ?Lori Perkins is a 51 y.o. year old female with a history of abnormal uterine bleed secondary to uterine fibroids with associated anemia, eczema. ? ?Interval History: ?Today she presents requesting refill of prednisone for eczema.  At her last visit she was placed on betamethasone which she complains was too expensive for her and she just received a little tube.  She would like to revert back to triamcinolone which she previously received at urgent care and was cheaper.  I had referred her to dermatology but it appears she was informed they did not accept family-planning Medicaid. ? ?She was seen by hematology last month due to her anemia and notes reviewed.  She was not interested in iron infusion and wanted to remain on oral iron per notes per patient states she was given the option of either iron infusion or oral tablets and she chose oral iron replacement.  Last hemoglobin was 8.1 from 08/26/2021. ?She complains of constipation with iron tablets. ? ?I had referred her to GYN for evaluation of her fibroids and also to GI for colonoscopy but she is yet to hear back from either of those specialties. ?Past Medical History:  ?Diagnosis Date  ? GERD (gastroesophageal reflux disease)   ? History of abnormal cervical Pap smear 2013  ? 2013 Pap smear LSIL+, HPV not detected, 2014 Colposcopy with benign tissue   ? History of sexually transmitted disease 01/16/2014  ? BV, trichomonas  ? ? ?Past Surgical History:  ?Procedure Laterality Date  ? MULTIPLE TOOTH EXTRACTIONS    ? ? ?Family History  ?Problem Relation Age of Onset  ? Diabetes Mother   ? Hypertension Father   ? Stroke Father   ? Cancer Maternal Aunt   ? Breast cancer Maternal Aunt 75  ? Cancer Maternal Uncle   ? Diabetes Maternal Grandmother   ? Breast cancer Maternal Grandmother 38  ? ? ?Social History   ? ?Socioeconomic History  ? Marital status: Single  ?  Spouse name: Not on file  ? Number of children: Not on file  ? Years of education: Not on file  ? Highest education level: Some college, no degree  ?Occupational History  ? Not on file  ?Tobacco Use  ? Smoking status: Every Day  ?  Packs/day: 0.50  ?  Years: 20.00  ?  Pack years: 10.00  ?  Types: Cigarettes  ? Smokeless tobacco: Never  ?Vaping Use  ? Vaping Use: Former  ?Substance and Sexual Activity  ? Alcohol use: Yes  ?  Comment: occasionally  ? Drug use: Not Currently  ?  Types: Marijuana  ? Sexual activity: Yes  ?  Birth control/protection: Condom  ?Other Topics Concern  ? Not on file  ?Social History Narrative  ? Not on file  ? ?Social Determinants of Health  ? ?Financial Resource Strain: Not on file  ?Food Insecurity: Not on file  ?Transportation Needs: Not on file  ?Physical Activity: Not on file  ?Stress: Not on file  ?Social Connections: Not on file  ? ? ?Allergies  ?Allergen Reactions  ? Penicillins   ?  Has patient had a PCN reaction causing immediate rash, facial/tongue/throat swelling, SOB or lightheadedness with hypotension: Unknown ?Has patient had a PCN reaction causing severe rash involving mucus membranes or skin necrosis: Unknown ?Has patient had a PCN reaction that required  hospitalization: Unknown ?Has patient had a PCN reaction occurring within the last 10 years: No ?If all of the above answers are "NO", then may proceed with Cephalosporin use.  ? ? ?Outpatient Medications Prior to Visit  ?Medication Sig Dispense Refill  ? acetaminophen (TYLENOL) 500 MG tablet Take 1,000 mg by mouth every 6 (six) hours as needed for mild pain.     ? FeFum-FePoly-FA-B Cmp-C-Biot (INTEGRA PLUS) CAPS Take 1 capsule by mouth daily. 30 capsule 3  ? hydrOXYzine (ATARAX) 25 MG tablet Take 1 tablet (25 mg total) by mouth every 8 (eight) hours as needed for itching. 30 tablet 1  ? naproxen (NAPROSYN) 500 MG tablet Take 1 tablet (500 mg total) by mouth 2 (two)  times daily as needed for mild pain. 60 tablet 0  ? olopatadine (PATANOL) 0.1 % ophthalmic solution Place 1 drop into both eyes 2 (two) times daily. 5 mL 12  ? triamcinolone 0.1%-Aquaphor equivlanet 1:1 ointment mixture Apply topically 2 (two) times daily. 240 g 1  ? betamethasone dipropionate (DIPROLENE) 0.05 % ointment Apply topically 2 (two) times daily. 45 g 1  ? predniSONE (DELTASONE) 20 MG tablet Take 1 tablet (20 mg total) by mouth daily with breakfast. 5 tablet 0  ? ?No facility-administered medications prior to visit.  ? ? ? ?ROS ?Review of Systems  ?Constitutional:  Negative for activity change, appetite change and fatigue.  ?HENT:  Negative for congestion, sinus pressure and sore throat.   ?Eyes:  Negative for visual disturbance.  ?Respiratory:  Negative for cough, chest tightness, shortness of breath and wheezing.   ?Cardiovascular:  Negative for chest pain and palpitations.  ?Gastrointestinal:  Negative for abdominal distention, abdominal pain and constipation.  ?Endocrine: Negative for polydipsia.  ?Genitourinary:  Negative for dysuria and frequency.  ?Musculoskeletal:  Negative for arthralgias and back pain.  ?Skin:  Positive for rash.  ?Neurological:  Negative for tremors, light-headedness and numbness.  ?Hematological:  Does not bruise/bleed easily.  ?Psychiatric/Behavioral:  Negative for agitation and behavioral problems.   ? ?Objective:  ?BP 118/73   Pulse 95   Ht '5\' 7"'$  (1.702 m)   Wt 126 lb 6.4 oz (57.3 kg)   SpO2 100%   BMI 19.80 kg/m?  ? ? ?  09/24/2021  ?  2:35 PM 08/26/2021  ? 11:29 AM 08/14/2021  ?  5:00 PM  ?BP/Weight  ?Systolic BP 993 716 967  ?Diastolic BP 73 85 88  ?Wt. (Lbs) 126.4 119.7   ?BMI 19.8 kg/m2 18.75 kg/m2   ? ? ? ? ?Physical Exam ?Constitutional:   ?   Appearance: She is well-developed.  ?Cardiovascular:  ?   Rate and Rhythm: Normal rate.  ?   Heart sounds: Normal heart sounds. No murmur heard. ?Pulmonary:  ?   Effort: Pulmonary effort is normal.  ?   Breath sounds: Normal  breath sounds. No wheezing or rales.  ?Chest:  ?   Chest wall: No tenderness.  ?Abdominal:  ?   General: Bowel sounds are normal. There is no distension.  ?   Palpations: Abdomen is soft. There is no mass.  ?   Tenderness: There is no abdominal tenderness.  ?Musculoskeletal:     ?   General: Normal range of motion.  ?   Right lower leg: No edema.  ?   Left lower leg: No edema.  ?Neurological:  ?   Mental Status: She is alert and oriented to person, place, and time.  ?Psychiatric:     ?  Mood and Affect: Mood normal.  ? ? ? ?  Latest Ref Rng & Units 08/26/2021  ? 11:16 AM 08/14/2021  ?  3:40 PM 08/13/2021  ?  4:51 PM  ?CMP  ?Glucose 70 - 99 mg/dL 109   101   84    ?BUN 6 - 20 mg/dL '13   10   8    '$ ?Creatinine 0.44 - 1.00 mg/dL 0.85   0.69   0.67    ?Sodium 135 - 145 mmol/L 138   140   144    ?Potassium 3.5 - 5.1 mmol/L 3.7   3.4   3.9    ?Chloride 98 - 111 mmol/L 106   108   107    ?CO2 22 - 32 mmol/L '23   26   21    '$ ?Calcium 8.9 - 10.3 mg/dL 9.3   9.3   9.3    ?Total Protein 6.5 - 8.1 g/dL 7.6   7.9   6.9    ?Total Bilirubin 0.3 - 1.2 mg/dL 0.3   0.1   <0.2    ?Alkaline Phos 38 - 126 U/L 49   51   60    ?AST 15 - 41 U/L '9   13   12    '$ ?ALT 0 - 44 U/L '5   7   7    '$ ? ? ?Lipid Panel  ?   ?Component Value Date/Time  ? CHOL 149 05/17/2015 1141  ? TRIG 37 05/17/2015 1141  ? HDL 65 05/17/2015 1141  ? CHOLHDL 2.3 05/17/2015 1141  ? LDLCALC 77 05/17/2015 1141  ? ? ?CBC ?   ?Component Value Date/Time  ? WBC 12.5 (H) 08/26/2021 1116  ? WBC 14.5 (H) 08/14/2021 1540  ? RBC 3.90 08/26/2021 1116  ? HGB 8.1 (L) 08/26/2021 1116  ? HGB 6.4 (LL) 08/13/2021 1651  ? HCT 28.9 (L) 08/26/2021 1116  ? HCT 23.8 (L) 08/13/2021 1651  ? PLT 566 (H) 08/26/2021 1116  ? PLT 474 (H) 08/13/2021 1651  ? MCV 74.1 (L) 08/26/2021 1116  ? MCV 71 (L) 08/13/2021 1651  ? MCH 20.8 (L) 08/26/2021 1116  ? MCHC 28.0 (L) 08/26/2021 1116  ? RDW 27.1 (H) 08/26/2021 1116  ? RDW 18.0 (H) 08/13/2021 1651  ? LYMPHSABS 3.0 08/26/2021 1116  ? LYMPHSABS 2.6 08/13/2021 1651  ?  MONOABS 1.3 (H) 08/26/2021 1116  ? EOSABS 0.2 08/26/2021 1116  ? EOSABS 0.3 08/13/2021 1651  ? BASOSABS 0.1 08/26/2021 1116  ? BASOSABS 0.1 08/13/2021 1651  ? ? ?No results found for: HGBA1C ? ?Assessment & Plan:  ? ?

## 2021-10-28 ENCOUNTER — Encounter: Payer: Self-pay | Admitting: Family Medicine

## 2021-10-29 ENCOUNTER — Inpatient Hospital Stay: Payer: Self-pay | Admitting: Internal Medicine

## 2021-10-29 ENCOUNTER — Inpatient Hospital Stay: Payer: Self-pay

## 2021-11-10 ENCOUNTER — Inpatient Hospital Stay: Payer: Self-pay

## 2021-11-10 ENCOUNTER — Inpatient Hospital Stay: Payer: Self-pay | Admitting: Internal Medicine

## 2021-11-14 ENCOUNTER — Encounter (HOSPITAL_COMMUNITY): Payer: Self-pay

## 2021-11-14 ENCOUNTER — Other Ambulatory Visit: Payer: Self-pay

## 2021-11-14 ENCOUNTER — Other Ambulatory Visit: Payer: Self-pay | Admitting: Family Medicine

## 2021-11-14 ENCOUNTER — Emergency Department (HOSPITAL_COMMUNITY)
Admission: EM | Admit: 2021-11-14 | Discharge: 2021-11-14 | Disposition: A | Discharge status: Home / Self Care | Payer: Medicaid Other | Attending: Emergency Medicine | Admitting: Emergency Medicine

## 2021-11-14 DIAGNOSIS — L308 Other specified dermatitis: Secondary | ICD-10-CM | POA: Insufficient documentation

## 2021-11-14 MED ORDER — DEXAMETHASONE SODIUM PHOSPHATE 10 MG/ML IJ SOLN
10.0000 mg | Freq: Once | INTRAMUSCULAR | Status: AC
Start: 2021-11-14 — End: 2021-11-14
  Administered 2021-11-14: 10 mg via INTRAMUSCULAR
  Filled 2021-11-14: qty 1

## 2021-11-14 MED ORDER — TRIAMCINOLONE ACETONIDE 0.1 % EX OINT: TOPICAL_OINTMENT | Freq: Two times a day (BID) | CUTANEOUS | 1 refills | Status: DC

## 2021-11-14 MED ORDER — HYDROXYZINE HCL 25 MG PO TABS: 25.0000 mg | ORAL_TABLET | Freq: Four times a day (QID) | ORAL | 0 refills | Status: DC

## 2021-11-14 MED ORDER — PREDNISONE 10 MG (21) PO TBPK: ORAL_TABLET | Freq: Every day | ORAL | 0 refills | Status: DC

## 2021-11-14 NOTE — Telephone Encounter (Signed)
Pt called saying her rash has flared up and she either needs a prednisone inj or a refill on the ointment that she uses  CB#  [phone]

## 2021-11-14 NOTE — Telephone Encounter (Signed)
Requested medication (s) are due for refill today: yes  Requested medication (s) are on the active medication list: yes  Last refill:  prednisone 09/24/21 #5/0, triamcinolone cream 09/24/21  Future visit scheduled: yes  Notes to clinic:  not delegated. Pt saying her rash is flared up and asking for refill on either of these. Please advise     Requested Prescriptions  Pending Prescriptions Disp Refills   triamcinolone 0.1%-Aquaphor equivlanet 1:1 ointment mixture 240 g 1    Sig: Apply topically 2 (two) times daily.     This medication is a mixture and was not evaluated by refill protocols.     predniSONE (DELTASONE) 20 MG tablet 5 tablet 0    Sig: Take 1 tablet (20 mg total) by mouth daily with breakfast.     Not Delegated - Endocrinology:  Oral Corticosteroids Failed - 11/14/2021 12:51 PM      Failed - This refill cannot be delegated      Failed - Manual Review: Eye exam for IOP if prolonged treatment      Failed - Glucose (serum) in normal range and within 180 days    Glucose, Bld  Date Value Ref Range Status  08/26/2021 109 (H) 70 - 99 mg/dL Final    Comment:    Glucose reference range applies only to samples taken after fasting for at least 8 hours.         Failed - Bone Mineral Density or Dexa Scan completed in the last 2 years      76 - K in normal range and within 180 days    Potassium  Date Value Ref Range Status  08/26/2021 3.7 3.5 - 5.1 mmol/L Final         Passed - Na in normal range and within 180 days    Sodium  Date Value Ref Range Status  08/26/2021 138 135 - 145 mmol/L Final  08/13/2021 144 134 - 144 mmol/L Final         Passed - Last BP in normal range    BP Readings from Last 1 Encounters:  09/24/21 118/73         Passed - Valid encounter within last 6 months    Recent Outpatient Visits           1 month ago Fibroids, intramural   [place], Charlane Ferretti, MD   3 months ago Other eczema   [place], Charlane Ferretti, MD   2 years ago [place], MD       Future Appointments             In 3 months Charlott Rakes, MD [place]

## 2021-11-14 NOTE — Discharge Instructions (Signed)
Follow up with a dermatologist your primary care provider  Use medications as prescribed

## 2021-11-14 NOTE — ED Triage Notes (Signed)
Pt reports rash to palms of hands that has became worse. Pt endorses rash that has spread to left arm that began last night. Pt reports she has been taking prednisone without relief.

## 2021-11-14 NOTE — ED Provider Notes (Signed)
[facility] DEPT Provider Note   CSN: [id number] Arrival date & time: 11/14/21  1437     History  Chief Complaint  Patient presents with   Rash    Lori Perkins is a 51 y.o. female history of eczema, chronic anemia here for evaluation of rash.  Patient states she has persistent eczema type rash.  Has been seen by PCP in urgent care for similar.  States she typically does a round of prednisone as well as hydrocortisone cream and hydroxyzine which resolves her rash.  Rash is typically located to her bilateral hands, upper arms and trunk however it is located today.  She denies any bulla, vesicles.  She denies any chronic medications.  No intraoral lesions.  No fever, nausea or vomiting.  No sensation of throat closing.  Patient states rash today is typical of her chronic rash that she has been seen for multiple times.  HPI     Home Medications Prior to Admission medications   Medication Sig Start Date End Date Taking? Authorizing Provider  hydrOXYzine (ATARAX) 25 MG tablet Take 1 tablet (25 mg total) by mouth every 6 (six) hours. 11/14/21  Yes Payal Stanforth A, PA-C  predniSONE (STERAPRED UNI-PAK 21 TAB) 10 MG (21) TBPK tablet Take by mouth daily. Take 6 tabs by mouth daily  for 2 days, then 5 tabs for 2 days, then 4 tabs for 2 days, then 3 tabs for 2 days, 2 tabs for 2 days, then 1 tab by mouth daily for 2 days 11/14/21  Yes Gloristine Turrubiates A, PA-C  acetaminophen (TYLENOL) 500 MG tablet Take 1,000 mg by mouth every 6 (six) hours as needed for mild pain.     [provider]  FeFum-FePoly-FA-B Cmp-C-Biot (INTEGRA PLUS) CAPS Take 1 capsule by mouth daily. 08/26/21   Curt Bears, MD  naproxen (NAPROSYN) 500 MG tablet Take 1 tablet (500 mg total) by mouth 2 (two) times daily as needed for mild pain. 08/13/21   Charlott Rakes, MD  olopatadine (PATANOL) 0.1 % ophthalmic solution Place 1 drop into both eyes 2 (two) times daily. 07/18/20   Wieters,  Hallie C, PA-C  polyethylene glycol powder (GLYCOLAX/MIRALAX) 17 GM/SCOOP powder Take 17 g by mouth daily. 09/24/21   Charlott Rakes, MD  triamcinolone 0.1%-Aquaphor equivlanet 1:1 ointment mixture Apply topically 2 (two) times daily. 11/14/21   Mavrick Mcquigg A, PA-C  triamcinolone cream (KENALOG) 0.1 % Apply 1 application. topically 2 (two) times daily. 09/24/21   Charlott Rakes, MD  diphenhydrAMINE (BENADRYL) 25 MG tablet Take 1 tablet (25 mg total) by mouth every 6 (six) hours as needed. 09/12/19 02/06/20  Hall-Potvin, [place], PA-C      Allergies    Penicillins    Review of Systems   Review of Systems  Constitutional: Negative.   HENT: Negative.    Respiratory: Negative.    Cardiovascular: Negative.   Gastrointestinal: Negative.   Genitourinary: Negative.   Musculoskeletal: Negative.   Skin:  Positive for rash.  All other systems reviewed and are negative.   Physical Exam Updated Vital Signs BP (!) 155/97   Pulse 67   Temp 98 F (36.7 C) (Oral)   Resp 16   SpO2 100%  Physical Exam Vitals and nursing note reviewed.  Constitutional:      General: She is not in acute distress.    Appearance: She is well-developed. She is not ill-appearing, toxic-appearing or diaphoretic.  HENT:     Head: Atraumatic.     Mouth/Throat:  Mouth: Mucous membranes are moist.     Comments: No intraoral rash Eyes:     Pupils: Pupils are equal, round, and reactive to light.  Cardiovascular:     Rate and Rhythm: Normal rate.     Pulses: Normal pulses.     Heart sounds: Normal heart sounds.  Pulmonary:     Effort: Pulmonary effort is normal. No respiratory distress.     Breath sounds: Normal breath sounds.  Abdominal:     General: Bowel sounds are normal. There is no distension.     Palpations: Abdomen is soft.  Musculoskeletal:        General: Normal range of motion.     Cervical back: Normal range of motion.  Skin:    General: Skin is warm and dry.     Capillary Refill: Capillary  refill takes less than 2 seconds.     Findings: Rash present.     Comments: Erythematous papule is posterior trunk, bilateral upper extremities, palmar aspect bilateral hands.  No bulla, vesicles, target lesions, desquamated skin  Neurological:     General: No focal deficit present.     Mental Status: She is alert and oriented to person, place, and time.  Psychiatric:        Mood and Affect: Mood normal.      ED Results / Procedures / Treatments   Labs (all labs ordered are listed, but only abnormal results are displayed) Labs Reviewed - No data to display  EKG None  Radiology No results found.  Procedures Procedures    Medications Ordered in ED Medications  dexamethasone (DECADRON) injection 10 mg (10 mg Intramuscular Given 11/14/21 1624)   ED Course/ Medical Decision Making/ A&P     Rash consistent with dermatitis. Patient denies any difficulty breathing or swallowing.  Pt has a patent airway without stridor and is handling secretions without difficulty; no angioedema. No blisters, no pustules, no warmth, no draining sinus tracts, no superficial abscesses, no bullous impetigo, no vesicles, no desquamation, no target lesions with dusky purpura or a central bulla. Not tender to touch. No concern for superimposed infection. No concern for SJS, TEN, TSS, tick borne illness, syphilis or other life-threatening condition. Will discharge home with short course of steroids, pepcid and recommend Benadryl as needed for pruritis.  Given this is a chronic issue encourage patient follow-up with allergist versus dermatology.  The patient has been appropriately medically screened and/or stabilized in the ED. I have low suspicion for any other emergent medical condition which would require further screening, evaluation or treatment in the ED or require inpatient management.  Patient is hemodynamically stable and in no acute distress.  Patient able to ambulate in department prior to ED.   Evaluation does not show acute pathology that would require ongoing or additional emergent interventions while in the emergency department or further inpatient treatment.  I have discussed the diagnosis with the patient and answered all questions.  Pain is been managed while in the emergency department and patient has no further complaints prior to discharge.  Patient is comfortable with plan discussed in room and is stable for discharge at this time.  I have discussed strict return precautions for returning to the emergency department.  Patient was encouraged to follow-up with PCP/specialist refer to at discharge.                           Medical Decision Making Amount and/or Complexity of Data Reviewed External  Data Reviewed: labs and notes.  Risk OTC drugs. Prescription drug management. Parenteral controlled substances. Diagnosis or treatment significantly limited by social determinants of health.          Final Clinical Impression(s) / ED Diagnoses Final diagnoses:  Other eczema    Rx / DC Orders ED Discharge Orders          Ordered    predniSONE (STERAPRED UNI-PAK 21 TAB) 10 MG (21) TBPK tablet  Daily        11/14/21 1634    hydrOXYzine (ATARAX) 25 MG tablet  Every 6 hours        11/14/21 1634    triamcinolone 0.1%-Aquaphor equivlanet 1:1 ointment mixture  2 times daily        11/14/21 1634              Shayla Heming A, PA-C 11/14/21 2357    Gareth Morgan, MD 11/15/21 [phone]

## 2021-11-25 ENCOUNTER — Inpatient Hospital Stay: Payer: Self-pay

## 2021-11-25 ENCOUNTER — Inpatient Hospital Stay: Payer: Self-pay | Admitting: Internal Medicine

## 2021-11-25 ENCOUNTER — Other Ambulatory Visit: Payer: Self-pay

## 2021-11-28 ENCOUNTER — Telehealth: Payer: Self-pay | Admitting: Family Medicine

## 2021-11-28 MED ORDER — TRIAMCINOLONE ACETONIDE 0.025 % EX OINT: 1.0000 | TOPICAL_OINTMENT | Freq: Two times a day (BID) | CUTANEOUS | 0 refills | Status: DC

## 2021-11-28 NOTE — Telephone Encounter (Signed)
The patient called in stating she was seen the ER on July 7th and they gave her a prescription for a cream triamcinolone cream (KENALOG) 0.1 % to be sent to [facility] on [address] and they did not have it there. Can she get a new prescription for the 0.25% ointment as well instead of the 0.1% to be sent instead to  [facility] ([facility] [place] [facility] [place] Phone:  [phone]  Fax:  [phone]     Please assist patient further

## 2021-11-28 NOTE — Telephone Encounter (Signed)
Rx sent for the ointment that is covered.

## 2021-11-29 NOTE — Telephone Encounter (Signed)
Medication has been refilled and sent to pharmacy  

## 2021-12-08 ENCOUNTER — Other Ambulatory Visit: Payer: Self-pay

## 2021-12-08 ENCOUNTER — Inpatient Hospital Stay (HOSPITAL_BASED_OUTPATIENT_CLINIC_OR_DEPARTMENT_OTHER): Payer: Medicaid Other | Admitting: Internal Medicine

## 2021-12-08 ENCOUNTER — Inpatient Hospital Stay: Payer: Medicaid Other | Attending: Internal Medicine

## 2021-12-08 VITALS — BP 149/89 | HR 94 | Temp 98.4°F | Resp 15 | Wt 126.9 lb

## 2021-12-08 DIAGNOSIS — D500 Iron deficiency anemia secondary to blood loss (chronic): Secondary | ICD-10-CM

## 2021-12-08 DIAGNOSIS — R5383 Other fatigue: Secondary | ICD-10-CM | POA: Insufficient documentation

## 2021-12-08 DIAGNOSIS — N920 Excessive and frequent menstruation with regular cycle: Secondary | ICD-10-CM | POA: Insufficient documentation

## 2021-12-08 DIAGNOSIS — R42 Dizziness and giddiness: Secondary | ICD-10-CM | POA: Insufficient documentation

## 2021-12-08 LAB — IRON AND IRON BINDING CAPACITY (CC-WL,HP ONLY)
Iron: 37 ug/dL (ref 28–170)
Saturation Ratios: 9 % — ABNORMAL LOW (ref 10.4–31.8)
TIBC: 393 ug/dL (ref 250–450)
UIBC: 356 ug/dL (ref 148–442)

## 2021-12-08 LAB — CBC WITH DIFFERENTIAL (CANCER CENTER ONLY)
Abs Immature Granulocytes: 0.03 10*3/uL (ref 0.00–0.07)
Basophils Absolute: 0 10*3/uL (ref 0.0–0.1)
Basophils Relative: 0 %
Eosinophils Absolute: 0.1 10*3/uL (ref 0.0–0.5)
Eosinophils Relative: 1 %
HCT: 36.3 % (ref 36.0–46.0)
Hemoglobin: 11.8 g/dL — ABNORMAL LOW (ref 12.0–15.0)
Immature Granulocytes: 0 %
Lymphocytes Relative: 7 %
Lymphs Abs: 1 10*3/uL (ref 0.7–4.0)
MCH: 29.4 pg (ref 26.0–34.0)
MCHC: 32.5 g/dL (ref 30.0–36.0)
MCV: 90.3 fL (ref 80.0–100.0)
Monocytes Absolute: 0.6 10*3/uL (ref 0.1–1.0)
Monocytes Relative: 4 %
Neutro Abs: 12 10*3/uL — ABNORMAL HIGH (ref 1.7–7.7)
Neutrophils Relative %: 88 %
Platelet Count: 325 10*3/uL (ref 150–400)
RBC: 4.02 MIL/uL (ref 3.87–5.11)
RDW: 16 % — ABNORMAL HIGH (ref 11.5–15.5)
WBC Count: 13.6 10*3/uL — ABNORMAL HIGH (ref 4.0–10.5)
nRBC: 0 % (ref 0.0–0.2)

## 2021-12-08 NOTE — Progress Notes (Signed)
[place] [phone]   [phone]  OFFICE PROGRESS NOTE  Charlott Rakes, MD [address] [place] [address]  DIAGNOSIS: Iron deficiency anemia secondary to menorrhagia.  PRIOR THERAPY: None  CURRENT THERAPY: Integra +1 capsule p.o. daily  INTERVAL HISTORY: Lori Perkins 51 y.o. female returns to the clinic today for follow-up visit.  The patient is feeling fine today with no concerning complaints.  She has improvement in her fatigue and the dizzy spells.  She denied having any current chest pain, shortness of breath, cough or hemoptysis.  She has no nausea, vomiting, diarrhea or constipation.  She has no headache or visual changes.  She has been tolerating her treatment with Integra +1 capsule p.o. daily fairly well.  She is here today for evaluation and repeat blood work.  MEDICAL HISTORY: Past Medical History:  Diagnosis Date   GERD (gastroesophageal reflux disease)    History of abnormal cervical Pap smear 2013   2013 Pap smear LSIL+, HPV not detected, 2014 Colposcopy with benign tissue    History of sexually transmitted disease 01/16/2014   BV, trichomonas    ALLERGIES:  is allergic to penicillins.  MEDICATIONS:  Current Outpatient Medications  Medication Sig Dispense Refill   acetaminophen (TYLENOL) 500 MG tablet Take 1,000 mg by mouth every 6 (six) hours as needed for mild pain.      FeFum-FePoly-FA-B Cmp-C-Biot (INTEGRA PLUS) CAPS Take 1 capsule by mouth daily. 30 capsule 3   hydrOXYzine (ATARAX) 25 MG tablet Take 1 tablet (25 mg total) by mouth every 6 (six) hours. 12 tablet 0   naproxen (NAPROSYN) 500 MG tablet Take 1 tablet (500 mg total) by mouth 2 (two) times daily as needed for mild pain. 60 tablet 0   olopatadine (PATANOL) 0.1 % ophthalmic solution Place 1 drop into both eyes 2 (two) times daily. 5 mL 12   polyethylene glycol powder (GLYCOLAX/MIRALAX) 17 GM/SCOOP powder Take 17 g by mouth daily.  3350 g 1   predniSONE (STERAPRED UNI-PAK 21 TAB) 10 MG (21) TBPK tablet Take by mouth daily. Take 6 tabs by mouth daily  for 2 days, then 5 tabs for 2 days, then 4 tabs for 2 days, then 3 tabs for 2 days, 2 tabs for 2 days, then 1 tab by mouth daily for 2 days 42 tablet 0   triamcinolone (KENALOG) 0.025 % ointment Apply 1 Application topically 2 (two) times daily. 30 g 0   No current facility-administered medications for this visit.    SURGICAL HISTORY:  Past Surgical History:  Procedure Laterality Date   MULTIPLE TOOTH EXTRACTIONS      REVIEW OF SYSTEMS:  A comprehensive review of systems was negative.   PHYSICAL EXAMINATION: General appearance: alert, cooperative, and no distress Head: Normocephalic, without obvious abnormality, atraumatic Neck: no adenopathy, no JVD, supple, symmetrical, trachea midline, and thyroid not enlarged, symmetric, no tenderness/mass/nodules Lymph nodes: Cervical, supraclavicular, and axillary nodes normal. Resp: clear to auscultation bilaterally Back: symmetric, no curvature. ROM normal. No CVA tenderness. Cardio: regular rate and rhythm, S1, S2 normal, no murmur, click, rub or gallop GI: soft, non-tender; bowel sounds normal; no masses,  no organomegaly Extremities: extremities normal, atraumatic, no cyanosis or edema  ECOG PERFORMANCE STATUS: 0 - Asymptomatic  Blood pressure (!) 149/89, pulse 94, temperature 98.4 F (36.9 C), temperature source Oral, resp. rate 15, weight 126 lb 14.4 oz (57.6 kg), SpO2 100 %.  LABORATORY DATA: Lab Results  Component  Value Date   WBC 13.6 (H) 12/08/2021   HGB 11.8 (L) 12/08/2021   HCT 36.3 12/08/2021   MCV 90.3 12/08/2021   PLT 325 12/08/2021      Chemistry      Component Value Date/Time   NA 138 08/26/2021 1116   NA 144 08/13/2021 1651   K 3.7 08/26/2021 1116   CL 106 08/26/2021 1116   CO2 23 08/26/2021 1116   BUN 13 08/26/2021 1116   BUN 8 08/13/2021 1651   CREATININE 0.85 08/26/2021 1116       Component Value Date/Time   CALCIUM 9.3 08/26/2021 1116   ALKPHOS 49 08/26/2021 1116   AST 9 (L) 08/26/2021 1116   ALT 5 08/26/2021 1116   BILITOT 0.3 08/26/2021 1116       RADIOGRAPHIC STUDIES: No results found.  ASSESSMENT AND PLAN: This is a very pleasant 51 years old African-American female with iron deficiency anemia secondary to menorrhagia.  The patient started treatment with Integra +1 capsule p.o. daily 3 months ago and has been tolerating her treatment fairly well. She had repeat blood work today including CBC that showed improvement of her hemoglobin up to 11.7 and hematocrit 36.3%.  Her white blood count is elevated but she has been treated recently with Medrol Dosepak. Iron study and ferritin are still pending. I recommended for the patient to continue her current treatment with Integra +1 capsule p.o. daily for now. I will see her back for follow-up visit in 3 months for evaluation repeat CBC, iron study and ferritin. The patient was advised to call immediately if she has any concerning symptoms in the interval. The patient voices understanding of current disease status and treatment options and is in agreement with the current care plan.  All questions were answered. The patient knows to call the clinic with any problems, questions or concerns. We can certainly see the patient much sooner if necessary.  The total time spent in the appointment was 20 minutes.  Disclaimer: This note was dictated with voice recognition software. Similar sounding words can inadvertently be transcribed and may not be corrected upon review.

## 2021-12-09 LAB — FERRITIN: Ferritin: 27 ng/mL (ref 11–307)

## 2021-12-23 ENCOUNTER — Ambulatory Visit (INDEPENDENT_AMBULATORY_CARE_PROVIDER_SITE_OTHER): Payer: Medicaid Other | Admitting: Primary Care

## 2022-01-07 ENCOUNTER — Encounter: Payer: Self-pay | Admitting: Physician Assistant

## 2022-01-07 ENCOUNTER — Ambulatory Visit: Payer: Medicaid Other | Attending: Physician Assistant | Admitting: Physician Assistant

## 2022-01-07 VITALS — BP 162/92 | HR 79 | Ht 67.0 in

## 2022-01-07 DIAGNOSIS — D539 Nutritional anemia, unspecified: Secondary | ICD-10-CM | POA: Insufficient documentation

## 2022-01-07 DIAGNOSIS — Z79899 Other long term (current) drug therapy: Secondary | ICD-10-CM | POA: Insufficient documentation

## 2022-01-07 DIAGNOSIS — L308 Other specified dermatitis: Secondary | ICD-10-CM

## 2022-01-07 DIAGNOSIS — R030 Elevated blood-pressure reading, without diagnosis of hypertension: Secondary | ICD-10-CM | POA: Insufficient documentation

## 2022-01-07 DIAGNOSIS — L309 Dermatitis, unspecified: Secondary | ICD-10-CM | POA: Insufficient documentation

## 2022-01-07 DIAGNOSIS — Z7952 Long term (current) use of systemic steroids: Secondary | ICD-10-CM | POA: Insufficient documentation

## 2022-01-07 DIAGNOSIS — Z09 Encounter for follow-up examination after completed treatment for conditions other than malignant neoplasm: Secondary | ICD-10-CM

## 2022-01-07 MED ORDER — TRIAMCINOLONE ACETONIDE 0.1 % EX OINT: TOPICAL_OINTMENT | Freq: Two times a day (BID) | CUTANEOUS | 3 refills | Status: DC

## 2022-01-07 MED ORDER — OLOPATADINE HCL 0.1 % OP SOLN: 1.0000 [drp] | Freq: Two times a day (BID) | OPHTHALMIC | 12 refills | Status: DC

## 2022-01-07 MED ORDER — PREDNISONE 10 MG PO TABS: ORAL_TABLET | ORAL | 0 refills | Status: DC

## 2022-01-07 MED ORDER — CETIRIZINE HCL 10 MG PO TABS: 10.0000 mg | ORAL_TABLET | Freq: Every day | ORAL | 1 refills | Status: DC

## 2022-01-07 NOTE — Progress Notes (Signed)
Patient ID: Lori Perkins, female   DOB: 1971-02-18, 51 y.o.   MRN: [id number]    Lori Perkins, is a 51 y.o. female  DDU:[id number]  CBJ:[id number]  DOB - 09-16-1970  Chief Complaint  Patient presents with   Hospitalization Follow-up       Subjective:   Lori Perkins is a 51 y.o. female here today for a follow up visit After Ed visit 11/14/2021 for rash.  This has been ongoing/long standing and waxing and waning.  She has had derm referrals but has not scheduled appt.  Severe cases have required prednisone.  She is not on a dialy antihistamine but benadryl has helped with itching.    From ED note 11/14/2021: Lori Perkins is a 51 y.o. female history of eczema, chronic anemia here for evaluation of rash.  Patient states she has persistent eczema type rash.  Has been seen by PCP in urgent care for similar.  States she typically does a round of prednisone as well as hydrocortisone cream and hydroxyzine which resolves her rash.  Rash is typically located to her bilateral hands, upper arms and trunk however it is located today.  She denies any bulla, vesicles.  She denies any chronic medications.  No intraoral lesions.  No fever, nausea or vomiting.  No sensation of throat closing.  Patient states rash today is typical of her chronic rash that she has been seen for multiple times.  Rash consistent with dermatitis. Patient denies any difficulty breathing or swallowing.  Pt has a patent airway without stridor and is handling secretions without difficulty; no angioedema. No blisters, no pustules, no warmth, no draining sinus tracts, no superficial abscesses, no bullous impetigo, no vesicles, no desquamation, no target lesions with dusky purpura or a central bulla. Not tender to touch. No concern for superimposed infection. No concern for SJS, TEN, TSS, tick borne illness, syphilis or other life-threatening condition. Will discharge home with short course of steroids, pepcid and recommend  Benadryl as needed for pruritis.  Given this is a chronic issue encourage patient follow-up with allergist versus dermatology.   The patient has been appropriately medically screened and/or stabilized in the ED. I have low suspicion for any other emergent medical condition which would require further screening, evaluation or treatment in the ED or require inpatient management.   Patient is hemodynamically stable and in no acute distress.  Patient able to ambulate in department prior to ED.  Evaluation does not show acute pathology that would require ongoing or additional emergent interventions while in the emergency department or further inpatient treatment.  I have discussed the diagnosis with the patient and answered all questions.  Pain is been managed while in the emergency department and patient has no further complaints prior to discharge.  Patient is comfortable with plan discussed in room and is stable for discharge at this time.  I have discussed strict return precautions for returning to the emergency department.  Patient was encouraged to follow-up with PCP/specialist refer to at discharge.   No problems updated.  ALLERGIES: Allergies  Allergen Reactions   Penicillins     Has patient had a PCN reaction causing immediate rash, facial/tongue/throat swelling, SOB or lightheadedness with hypotension: Unknown Has patient had a PCN reaction causing severe rash involving mucus membranes or skin necrosis: Unknown Has patient had a PCN reaction that required hospitalization: Unknown Has patient had a PCN reaction occurring within the last 10 years: No If all of the above answers are "NO", then may proceed  with Cephalosporin use.    PAST MEDICAL HISTORY: Past Medical History:  Diagnosis Date   GERD (gastroesophageal reflux disease)    History of abnormal cervical Pap smear 2013   2013 Pap smear LSIL+, HPV not detected, 2014 Colposcopy with benign tissue    History of sexually transmitted  disease 01/16/2014   BV, trichomonas    MEDICATIONS AT HOME: Prior to Admission medications   Medication Sig Start Date End Date Taking? Authorizing Provider  acetaminophen (TYLENOL) 500 MG tablet Take 1,000 mg by mouth every 6 (six) hours as needed for mild pain.    Yes [provider]  cetirizine (ZYRTEC) 10 MG tablet Take 1 tablet (10 mg total) by mouth daily. 01/07/22  Yes Donelda Mailhot, Dionne Bucy, PA-C  FeFum-FePoly-FA-B Cmp-C-Biot (INTEGRA PLUS) CAPS Take 1 capsule by mouth daily. 08/26/21  Yes Curt Bears, MD  hydrOXYzine (ATARAX) 25 MG tablet Take 1 tablet (25 mg total) by mouth every 6 (six) hours. 11/14/21  Yes Henderly, Britni A, PA-C  naproxen (NAPROSYN) 500 MG tablet Take 1 tablet (500 mg total) by mouth 2 (two) times daily as needed for mild pain. 08/13/21  Yes Charlott Rakes, MD  polyethylene glycol powder (GLYCOLAX/MIRALAX) 17 GM/SCOOP powder Take 17 g by mouth daily. 09/24/21  Yes Charlott Rakes, MD  predniSONE (DELTASONE) 10 MG tablet 6,5,4,3,2,1 take if you have a severe flare of eczema.  Take each days dose all at once with food 01/07/22  Yes Jaela Yepez M, PA-C  triamcinolone (KENALOG) 0.025 % ointment Apply 1 Application topically 2 (two) times daily. 11/28/21  Yes Charlott Rakes, MD  triamcinolone 0.1%-Aquaphor equivlanet 1:1 ointment mixture Apply topically 2 (two) times daily. 01/07/22  Yes Gavrielle Streck M, PA-C  olopatadine (PATANOL) 0.1 % ophthalmic solution Place 1 drop into both eyes 2 (two) times daily. 01/07/22   Argentina Donovan, PA-C  diphenhydrAMINE (BENADRYL) 25 MG tablet Take 1 tablet (25 mg total) by mouth every 6 (six) hours as needed. 09/12/19 02/06/20  Hall-Potvin, [place], PA-C    ROS: Neg HEENT Neg resp Neg cardiac Neg GI Neg GU Neg MS Neg psych Neg neuro  Objective:   Vitals:   01/07/22 1412  BP: (!) 162/92  Pulse: 79  SpO2: 100%  Height: '5\' 7"'$  (1.702 m)   Exam General appearance : Awake, alert, not in any distress. Speech Clear.  Not toxic looking HEENT: Atraumatic and Normocephalic Neck: Supple, no JVD. No cervical lymphadenopathy.  Chest: Good air entry bilaterally, CTAB.  No rales/rhonchi/wheezing CVS: S1 S2 regular, no murmurs.  Extremities: B/L Lower Ext shows no edema, both legs are warm to touch Neurology: Awake alert, and oriented X 3, CN II-XII intact, Non focal Skin: few scattered patches of dry skin on neck, hands and elbows  Data Review No results found for: "HGBA1C"  Assessment & Plan   1. Other eczema Daily zyrtec may help - cetirizine (ZYRTEC) 10 MG tablet; Take 1 tablet (10 mg total) by mouth daily.  Dispense: 100 tablet; Refill: 1 - triamcinolone 0.1%-Aquaphor equivlanet 1:1 ointment mixture; Apply topically 2 (two) times daily.  Dispense: 240 g; Refill: 3 Hold and only use if severe case- predniSONE (DELTASONE) 10 MG tablet; 6,5,4,3,2,1 take if you have a severe flare of eczema.  Take each days dose all at once with food  Dispense: 21 tablet; Refill: 0 - Ambulatory referral to Dermatology  2. Encounter for examination following treatment at hospital  3. Elevated blood pressure reading Check blood pressures 3 to 4 times weekly and  record before your next visit.  Bring these numbers with you to your next appt.     Return for keep appt with Dr Margarita Rana in October.  The patient was given clear instructions to go to ER or return to medical center if symptoms don't improve, worsen or new problems develop. The patient verbalized understanding. The patient was told to call to get lab results if they haven't heard anything in the next week.      Freeman Caldron, PA-C [facility], [place]   01/07/2022, 2:43 PM

## 2022-01-07 NOTE — Patient Instructions (Addendum)
Check blood pressures 3 to 4 times weekly and record before your next visit.  Bring these numbers with you to your next appt.     Eczema Eczema refers to a group of skin conditions that cause skin to become rough and inflamed. Each type of eczema has different triggers, symptoms, and treatments. Eczema of any type is usually itchy. Symptoms range from mild to severe. Eczema is not spread from person to person (is not contagious). It can appear on different parts of the body at different times. One person's eczema may look different from another person's eczema. What are the causes? The exact cause of this condition is not known. However, exposure to certain environmental factors, irritants, and allergens can make the condition worse. What are the signs or symptoms? Symptoms of this condition depend on the type of eczema you have. The types include: Contact dermatitis. There are two kinds: Irritant contact dermatitis. This happens when something irritates the skin and causes a rash. Allergic contact dermatitis. This happens when your skin comes in contact with something you are allergic to (allergens). This can include poison ivy, chemicals, or medicines that were applied to your skin. Atopic dermatitis. This is a long-term (chronic) skin disease that keeps coming back (recurring). It is the most common type of eczema. Usual symptoms are a red rash and itchy, dry, scaly skin. It usually starts showing signs in infancy and can last through adulthood. Dyshidrotic eczema. This is a form of eczema on the hands and feet. It shows up as very itchy, fluid-filled blisters. It can affect people of any age but is more common before age 47. Hand eczema. This causes very itchy areas of skin on the palms and sides of the hands and fingers. This type of eczema is common in industrial jobs where you may be exposed to different types of irritants. Lichen simplex chronicus. This type of eczema occurs when a person  constantly scratches one area of the body. Repeated scratching of the area leads to thickened skin (lichenification). This condition can accompany other types of eczema. It is more common in adults but may also be seen in children. Nummular eczema. This is a common type of eczema that most often affects the lower legs and the backs of the hands. It typically causes an itchy, red, circular, crusty lesion (plaque). Scratching may become a habit and can cause bleeding. Nummular eczema occurs most often in middle-aged or older people. Seborrheic dermatitis. This is a common skin disease that mainly affects the scalp. It may also affect other oily areas of the body, such as the face, sides of the nose, eyebrows, ears, eyelids, and chest. It is marked by small scaling and redness of the skin (erythema). This can affect people of all ages. In infants, this condition is called cradle cap. Stasis dermatitis. This is a common skin disease that can cause itching, scaling, and hyperpigmentation, usually on the legs and feet. It occurs most often in people who have a condition that prevents blood from being pumped through the veins in the legs (chronic venous insufficiency). Stasis dermatitis is a chronic condition that needs long-term management. How is this diagnosed? This condition may be diagnosed based on: A physical exam of your skin. Your medical history. Skin patch tests. These tests involve using patches that contain possible allergens and placing them on your back. Your health care provider will check in a few days to see if an allergic reaction occurred. How is this treated? Treatment for  eczema is based on the type of eczema you have. You may be given hydrocortisone steroid medicine or antihistamines. These can relieve itching quickly and help reduce inflammation. These may be prescribed or purchased over the counter, depending on the strength that is needed. Follow these instructions at home: Take or  apply over-the-counter and prescription medicines only as told by your health care provider. Use creams or ointments to moisturize your skin. Do not use lotions. Learn what triggers or irritates your symptoms so you can avoid these things. Treat symptom flare-ups quickly. Do not scratch your skin. This can make your rash worse. Keep all follow-up visits. This is important. Where to find more information American Academy of Dermatology: MemberVerification.ca National Eczema Association: nationaleczema.org The Society for Pediatric Dermatology: pedsderm.net Contact a health care provider if: You have severe itching, even with treatment. You scratch your skin regularly until it bleeds. Your rash looks different than usual. Your skin is painful, swollen, or more red than usual. You have a fever. Summary Eczema refers to a group of skin conditions that cause skin to become rough and inflamed. Each type has different triggers. Eczema of any type causes itching that may range from mild to severe. Treatment varies based on the type of eczema you have. Hydrocortisone steroid medicine or antihistamines can help with itching and inflammation. Protecting your skin is the best way to prevent eczema. Use creams or ointments to moisturize your skin. Avoid triggers and irritants. Treat flare-ups quickly. This information is not intended to replace advice given to you by your health care provider. Make sure you discuss any questions you have with your health care provider. Document Revised: 02/05/2020 Document Reviewed: 02/05/2020 Elsevier Patient Education  [place].

## 2022-01-13 ENCOUNTER — Other Ambulatory Visit: Payer: Self-pay | Admitting: Family Medicine

## 2022-01-13 NOTE — Telephone Encounter (Signed)
Patient states the wrong prescription was sent in, patient would like triamcinolone acetonide usp 0.025 and hydrOXYzine (ATARAX) 25 MG tablet please send to    [facility] [place] [address] Phone:  [phone]  Fax:  [phone]     Patient states she does not want cetirizine (ZYRTEC) 10 MG tablet but would like what PCP has been prescribing her which is the hydrOXYzine (ATARAX) 25 MG tablet.    Patient would like triamcinolone 0.1%-Aquaphor equivlanet 1:1 ointment mixture, please send to   [facility], [place], [place] [address] [phone]

## 2022-01-14 MED ORDER — TRIAMCINOLONE ACETONIDE 0.025 % EX OINT: 1.0000 | TOPICAL_OINTMENT | Freq: Two times a day (BID) | CUTANEOUS | 0 refills | Status: DC

## 2022-01-14 NOTE — Telephone Encounter (Signed)
Requested medication (s) are due for refill today: yes  Requested medication (s) are on the active medication list: yes    Last refill: 11/14/21  #12  0 refills  Future visit scheduled yes 02/16/22  Notes to clinic:Historical provider. Please review.  Requested Prescriptions  Pending Prescriptions Disp Refills   hydrOXYzine (ATARAX) 25 MG tablet 12 tablet 0    Sig: Take 1 tablet (25 mg total) by mouth every 6 (six) hours.     Ear, Nose, and Throat:  Antihistamines 2 Passed - 01/14/2022  1:23 PM      Passed - Cr in normal range and within 360 days    Creatinine  Date Value Ref Range Status  08/26/2021 0.85 0.44 - 1.00 mg/dL Final         Passed - Valid encounter within last 12 months    Recent Outpatient Visits           1 week ago Other [place], [place], [place]   3 months ago Fibroids, intramural   [place], Charlane Ferretti, MD   5 months ago Other eczema   [place], Charlane Ferretti, MD   2 years ago [place], [place], MD       Future Appointments             In 1 month Charlott Rakes, MD [place]   In 7 months Ralene Bathe, MD [place]            Signed Prescriptions Disp Refills   triamcinolone (KENALOG) 0.025 % ointment 30 g 0    Sig: Apply 1 Application topically 2 (two) times daily.     Not Delegated - Dermatology:  Corticosteroids Failed - 01/14/2022 12:00 PM      Failed - This refill cannot be delegated      Passed - Valid encounter within last 12 months    Recent Outpatient Visits           1 week ago Other [place], [place]   3 months ago Fibroids, intramural   [place], Charlane Ferretti, MD   5 months ago Other eczema   [place], Charlane Ferretti, MD   2 years ago [place], [place], MD       Future Appointments             In 1 month Charlott Rakes, MD [employer]   In 7 months Ralene Bathe, MD [place]

## 2022-01-14 NOTE — Telephone Encounter (Signed)
Pt called requesting to speak to a nurse about these refills  [phone]

## 2022-01-15 MED ORDER — HYDROXYZINE HCL 25 MG PO TABS: 25.0000 mg | ORAL_TABLET | Freq: Four times a day (QID) | ORAL | 0 refills | Status: DC

## 2022-02-16 ENCOUNTER — Ambulatory Visit: Payer: Commercial Managed Care - HMO | Attending: Family Medicine | Admitting: Family Medicine

## 2022-02-16 ENCOUNTER — Encounter: Payer: Self-pay | Admitting: Family Medicine

## 2022-02-16 VITALS — BP 149/94 | HR 93 | Temp 98.0°F | Ht 67.0 in | Wt 127.2 lb

## 2022-02-16 DIAGNOSIS — Z79899 Other long term (current) drug therapy: Secondary | ICD-10-CM

## 2022-02-16 DIAGNOSIS — Z131 Encounter for screening for diabetes mellitus: Secondary | ICD-10-CM

## 2022-02-16 DIAGNOSIS — D500 Iron deficiency anemia secondary to blood loss (chronic): Secondary | ICD-10-CM

## 2022-02-16 DIAGNOSIS — I10 Essential (primary) hypertension: Secondary | ICD-10-CM

## 2022-02-16 DIAGNOSIS — L308 Other specified dermatitis: Secondary | ICD-10-CM | POA: Diagnosis not present

## 2022-02-16 LAB — POCT GLYCOSYLATED HEMOGLOBIN (HGB A1C): HbA1c, POC (controlled diabetic range): 5.6 % (ref 0.0–7.0)

## 2022-02-16 MED ORDER — TRIAMCINOLONE ACETONIDE 0.025 % EX OINT: 1.0000 | TOPICAL_OINTMENT | Freq: Two times a day (BID) | CUTANEOUS | 1 refills | Status: DC

## 2022-02-16 MED ORDER — HYDROXYZINE HCL 25 MG PO TABS: 25.0000 mg | ORAL_TABLET | Freq: Three times a day (TID) | ORAL | 1 refills | Status: DC | PRN

## 2022-02-16 MED ORDER — AMLODIPINE BESYLATE 2.5 MG PO TABS: 2.5000 mg | ORAL_TABLET | Freq: Every day | ORAL | 1 refills | Status: AC

## 2022-02-16 NOTE — Patient Instructions (Signed)
Please call to schedule your colonoscopy as the Gastroenterology office has been trying to reach you:  [facility] [address] [place], [place] [address] PH# [phone]

## 2022-02-16 NOTE — Progress Notes (Signed)
Subjective:  Patient ID: Lori Perkins, female    DOB: 07-18-1970  Age: 51 y.o. MRN: [id number]  CC: Eczema (Eczema f/u. Med refill./No flu vax.)   HPI Lori Perkins is a 51 y.o. year old female with a history of abnormal uterine bleed secondary to uterine fibroids with associated anemia, eczema.  Interval History: Her eczema flared up in her neck, cubital fossa.  She has used Eucerin, Aquaphor, Vaseline for skin care. At her last visit the physician assistant had placed her on Zyrtec for pruritus but Zyrtec has been ineffective Steroid creams are ineffective for flare ups just Prednisone provides relief but as soon as she is done with a course of prednisone she has another flare.  She did have a Medrol Dosepak prescribed on 01/07/2022 however she states she just took 1 or 2 prednisone tablets daily rather than as prescribed. Referred to dermatology and she did receive a letter from the practice but is unsure of her appointment date which she thinks might be sometime in 2024.  Blood pressure has been persistently elevated and at home blood pressure readings have also been above 140/90. She remains on Integra for management of her iron deficiency anemia secondary to iron deficiency anemia from menorrhagia.  Last visit to hematology was in 11/2021. Past Medical History:  Diagnosis Date   GERD (gastroesophageal reflux disease)    History of abnormal cervical Pap smear 2013   2013 Pap smear LSIL+, HPV not detected, 2014 Colposcopy with benign tissue    History of sexually transmitted disease 01/16/2014   BV, trichomonas    Past Surgical History:  Procedure Laterality Date   MULTIPLE TOOTH EXTRACTIONS      Family History  Problem Relation Age of Onset   Diabetes Mother    Hypertension Father    Stroke Father    Cancer Maternal Aunt    Breast cancer Maternal Aunt 39   Cancer Maternal Uncle    Diabetes Maternal Grandmother    Breast cancer Maternal Grandmother 80     Social History   Socioeconomic History   Marital status: Single    Spouse name: Not on file   Number of children: Not on file   Years of education: Not on file   Highest education level: Some college, no degree  Occupational History   Not on file  Tobacco Use   Smoking status: Every Day    Packs/day: 0.50    Years: 20.00    Total pack years: 10.00    Types: Cigarettes   Smokeless tobacco: Never  Vaping Use   Vaping Use: Former  Substance and Sexual Activity   Alcohol use: Yes    Comment: occasionally   Drug use: Not Currently    Types: Marijuana   Sexual activity: Yes    Birth control/protection: Condom  Other Topics Concern   Not on file  Social History Narrative   Not on file   Social Determinants of Health   Financial Resource Strain: Not on file  Food Insecurity: Not on file  Transportation Needs: No Transportation Needs (06/01/2019)   PRAPARE - Hydrologist (Medical): No    Lack of Transportation (Non-Medical): No  Physical Activity: Not on file  Stress: Not on file  Social Connections: Not on file    Allergies  Allergen Reactions   Penicillins     Has patient had a PCN reaction causing immediate rash, facial/tongue/throat swelling, SOB or lightheadedness with hypotension: Unknown Has patient had  a PCN reaction causing severe rash involving mucus membranes or skin necrosis: Unknown Has patient had a PCN reaction that required hospitalization: Unknown Has patient had a PCN reaction occurring within the last 10 years: No If all of the above answers are "NO", then may proceed with Cephalosporin use.    Outpatient Medications Prior to Visit  Medication Sig Dispense Refill   acetaminophen (TYLENOL) 500 MG tablet Take 1,000 mg by mouth every 6 (six) hours as needed for mild pain.      FeFum-FePoly-FA-B Cmp-C-Biot (INTEGRA PLUS) CAPS Take 1 capsule by mouth daily. 30 capsule 3   predniSONE (DELTASONE) 10 MG tablet 6,5,4,3,2,1  take if you have a severe flare of eczema.  Take each days dose all at once with food 21 tablet 0   polyethylene glycol powder (GLYCOLAX/MIRALAX) 17 GM/SCOOP powder Take 17 g by mouth daily. (Patient not taking: Reported on 02/16/2022) 3350 g 1   cetirizine (ZYRTEC) 10 MG tablet Take 1 tablet (10 mg total) by mouth daily. (Patient not taking: Reported on 02/16/2022) 100 tablet 1   hydrOXYzine (ATARAX) 25 MG tablet Take 1 tablet (25 mg total) by mouth every 6 (six) hours. (Patient not taking: Reported on 02/16/2022) 12 tablet 0   naproxen (NAPROSYN) 500 MG tablet Take 1 tablet (500 mg total) by mouth 2 (two) times daily as needed for mild pain. (Patient not taking: Reported on 02/16/2022) 60 tablet 0   olopatadine (PATANOL) 0.1 % ophthalmic solution Place 1 drop into both eyes 2 (two) times daily. (Patient not taking: Reported on 02/16/2022) 5 mL 12   triamcinolone (KENALOG) 0.025 % ointment Apply 1 Application topically 2 (two) times daily. (Patient not taking: Reported on 02/16/2022) 30 g 0   No facility-administered medications prior to visit.     ROS Review of Systems  Constitutional:  Negative for activity change and appetite change.  HENT:  Negative for sinus pressure and sore throat.   Respiratory:  Negative for chest tightness, shortness of breath and wheezing.   Cardiovascular:  Negative for chest pain and palpitations.  Gastrointestinal:  Negative for abdominal distention, abdominal pain and constipation.  Genitourinary: Negative.   Musculoskeletal: Negative.   Skin:  Positive for rash.  Psychiatric/Behavioral:  Negative for behavioral problems and dysphoric mood.     Objective:  BP (!) 149/94 (BP Location: Left Arm, Patient Position: Sitting, Cuff Size: Normal)   Pulse 93   Temp 98 F (36.7 C) (Oral)   Ht '5\' 7"'$  (1.702 m)   Wt 127 lb 3.2 oz (57.7 kg)   SpO2 100%   BMI 19.92 kg/m      02/16/2022    4:17 PM 02/16/2022    3:30 PM 01/07/2022    2:12 PM  BP/Weight  Systolic BP  366 294 765  Diastolic BP 94 82 92  Wt. (Lbs)  127.2   BMI  19.92 kg/m2       Physical Exam Constitutional:      Appearance: She is well-developed.  Cardiovascular:     Rate and Rhythm: Normal rate.     Heart sounds: Normal heart sounds. No murmur heard. Pulmonary:     Effort: Pulmonary effort is normal.     Breath sounds: Normal breath sounds. No wheezing or rales.  Chest:     Chest wall: No tenderness.  Abdominal:     General: Bowel sounds are normal. There is no distension.     Palpations: Abdomen is soft. There is no mass.     Tenderness:  There is no abdominal tenderness.  Musculoskeletal:        General: Normal range of motion.     Right lower leg: No edema.     Left lower leg: No edema.  Skin:    Comments: Hyperpigmented rash on left side of neck, bilateral antecubital fossa.  Neurological:     Mental Status: She is alert and oriented to person, place, and time.  Psychiatric:        Mood and Affect: Mood normal.        Latest Ref Rng & Units 08/26/2021   11:16 AM 08/14/2021    3:40 PM 08/13/2021    4:51 PM  CMP  Glucose 70 - 99 mg/dL 109  101  84   BUN 6 - 20 mg/dL '13  10  8   '$ Creatinine 0.44 - 1.00 mg/dL 0.85  0.69  0.67   Sodium 135 - 145 mmol/L 138  140  144   Potassium 3.5 - 5.1 mmol/L 3.7  3.4  3.9   Chloride 98 - 111 mmol/L 106  108  107   CO2 22 - 32 mmol/L '23  26  21   '$ Calcium 8.9 - 10.3 mg/dL 9.3  9.3  9.3   Total Protein 6.5 - 8.1 g/dL 7.6  7.9  6.9   Total Bilirubin 0.3 - 1.2 mg/dL 0.3  0.1  <0.2   Alkaline Phos 38 - 126 U/L 49  51  60   AST 15 - 41 U/L '9  13  12   '$ ALT 0 - 44 U/L '5  7  7     '$ Lipid Panel     Component Value Date/Time   CHOL 149 05/17/2015 1141   TRIG 37 05/17/2015 1141   HDL 65 05/17/2015 1141   CHOLHDL 2.3 05/17/2015 1141   LDLCALC 77 05/17/2015 1141    CBC    Component Value Date/Time   WBC 13.6 (H) 12/08/2021 1502   WBC 14.5 (H) 08/14/2021 1540   RBC 4.02 12/08/2021 1502   HGB 11.8 (L) 12/08/2021 1502   HGB 6.4  (LL) 08/13/2021 1651   HCT 36.3 12/08/2021 1502   HCT 23.8 (L) 08/13/2021 1651   PLT 325 12/08/2021 1502   PLT 474 (H) 08/13/2021 1651   MCV 90.3 12/08/2021 1502   MCV 71 (L) 08/13/2021 1651   MCH 29.4 12/08/2021 1502   MCHC 32.5 12/08/2021 1502   RDW 16.0 (H) 12/08/2021 1502   RDW 18.0 (H) 08/13/2021 1651   LYMPHSABS 1.0 12/08/2021 1502   LYMPHSABS 2.6 08/13/2021 1651   MONOABS 0.6 12/08/2021 1502   EOSABS 0.1 12/08/2021 1502   EOSABS 0.3 08/13/2021 1651   BASOSABS 0.0 12/08/2021 1502   BASOSABS 0.1 08/13/2021 1651    No results found for: "HGBA1C"  Assessment & Plan:  1. Other eczema Uncontrolled with frequent flares She currently does not use any topical steroid cream Complains that the steroid creams have been ineffective in the past and her flareups only respond to oral prednisone Advised that I am concerned about her frequent need for prednisone She will benefit from being on a biologic agent She had been referred to dermatology but she is unsure of her appointment and I have advised her to call to verify this Emphasized the need for skin care. - triamcinolone (KENALOG) 0.025 % ointment; Apply 1 Application topically 2 (two) times daily.  Dispense: 80 g; Refill: 1 - hydrOXYzine (ATARAX) 25 MG tablet; Take 1 tablet (25 mg total) by mouth  every 8 (eight) hours as needed.  Dispense: 60 tablet; Refill: 1  2. Screening for diabetes mellitus A1c is 5.6  3. Iron deficiency anemia due to chronic blood loss Secondary to menorrhagia Last hemoglobin was 11.8 in 11/2021 Currently on Integra Followed by hematology  4. High risk medication use Recurrent need for oral prednisone Discussed side effects of chronic prednisone use including adrenal suppression, diabetes, hypertension, osteopenia   5. Primary hypertension Blood pressures have been running high for the last couple of months and she is not keen on a medication We have discussed complications of untreated  hypertension - amLODipine (NORVASC) 2.5 MG tablet; Take 1 tablet (2.5 mg total) by mouth daily.  Dispense: 90 tablet; Refill: 1   Health Care Maintenance: Declines Meds ordered this encounter  Medications   triamcinolone (KENALOG) 0.025 % ointment    Sig: Apply 1 Application topically 2 (two) times daily.    Dispense:  80 g    Refill:  1   hydrOXYzine (ATARAX) 25 MG tablet    Sig: Take 1 tablet (25 mg total) by mouth every 8 (eight) hours as needed.    Dispense:  60 tablet    Refill:  1   amLODipine (NORVASC) 2.5 MG tablet    Sig: Take 1 tablet (2.5 mg total) by mouth daily.    Dispense:  90 tablet    Refill:  1    Follow-up: Return in about 6 months (around 08/18/2022).       Charlott Rakes, MD, FAAFP. [facility] and [place] [place], [place]   02/16/2022, 4:28 PM

## 2022-03-02 ENCOUNTER — Encounter: Payer: Medicaid Other | Admitting: Obstetrics and Gynecology

## 2022-03-09 ENCOUNTER — Telehealth: Payer: Self-pay | Admitting: Pharmacy Technician

## 2022-03-09 ENCOUNTER — Other Ambulatory Visit: Payer: Self-pay | Admitting: Pharmacy Technician

## 2022-03-09 ENCOUNTER — Inpatient Hospital Stay (HOSPITAL_BASED_OUTPATIENT_CLINIC_OR_DEPARTMENT_OTHER): Payer: Commercial Managed Care - HMO | Admitting: Internal Medicine

## 2022-03-09 ENCOUNTER — Inpatient Hospital Stay: Payer: Commercial Managed Care - HMO | Attending: Internal Medicine

## 2022-03-09 ENCOUNTER — Other Ambulatory Visit: Payer: Self-pay | Admitting: Internal Medicine

## 2022-03-09 ENCOUNTER — Telehealth: Payer: Self-pay | Admitting: Medical Oncology

## 2022-03-09 VITALS — BP 156/95 | HR 99 | Temp 98.7°F | Resp 19 | Wt 123.4 lb

## 2022-03-09 DIAGNOSIS — I10 Essential (primary) hypertension: Secondary | ICD-10-CM | POA: Diagnosis not present

## 2022-03-09 DIAGNOSIS — D500 Iron deficiency anemia secondary to blood loss (chronic): Secondary | ICD-10-CM | POA: Insufficient documentation

## 2022-03-09 DIAGNOSIS — N920 Excessive and frequent menstruation with regular cycle: Secondary | ICD-10-CM | POA: Insufficient documentation

## 2022-03-09 LAB — CBC WITH DIFFERENTIAL (CANCER CENTER ONLY)
Abs Immature Granulocytes: 0.02 10*3/uL (ref 0.00–0.07)
Basophils Absolute: 0.1 10*3/uL (ref 0.0–0.1)
Basophils Relative: 1 %
Eosinophils Absolute: 0.3 10*3/uL (ref 0.0–0.5)
Eosinophils Relative: 3 %
HCT: 31.7 % — ABNORMAL LOW (ref 36.0–46.0)
Hemoglobin: 9.8 g/dL — ABNORMAL LOW (ref 12.0–15.0)
Immature Granulocytes: 0 %
Lymphocytes Relative: 23 %
Lymphs Abs: 2.2 10*3/uL (ref 0.7–4.0)
MCH: 26.1 pg (ref 26.0–34.0)
MCHC: 30.9 g/dL (ref 30.0–36.0)
MCV: 84.3 fL (ref 80.0–100.0)
Monocytes Absolute: 1 10*3/uL (ref 0.1–1.0)
Monocytes Relative: 11 %
Neutro Abs: 5.9 10*3/uL (ref 1.7–7.7)
Neutrophils Relative %: 62 %
Platelet Count: 434 10*3/uL — ABNORMAL HIGH (ref 150–400)
RBC: 3.76 MIL/uL — ABNORMAL LOW (ref 3.87–5.11)
RDW: 17.2 % — ABNORMAL HIGH (ref 11.5–15.5)
WBC Count: 9.5 10*3/uL (ref 4.0–10.5)
nRBC: 0 % (ref 0.0–0.2)

## 2022-03-09 LAB — IRON AND IRON BINDING CAPACITY (CC-WL,HP ONLY)
Iron: 20 ug/dL — ABNORMAL LOW (ref 28–170)
Saturation Ratios: 5 % — ABNORMAL LOW (ref 10.4–31.8)
TIBC: 391 ug/dL (ref 250–450)
UIBC: 371 ug/dL (ref 148–442)

## 2022-03-09 LAB — FERRITIN: Ferritin: 3 ng/mL — ABNORMAL LOW (ref 11–307)

## 2022-03-09 MED ORDER — INTEGRA PLUS PO CAPS: 1.0000 | ORAL_CAPSULE | Freq: Every day | ORAL | 3 refills | Status: AC

## 2022-03-09 NOTE — Telephone Encounter (Signed)
LVM about iron infusion and gave her number to call [employer].

## 2022-03-09 NOTE — Progress Notes (Signed)
Lori [phone]   [phone]  OFFICE PROGRESS NOTE  Charlott Rakes, MD [address] Lori Perkins [address]  DIAGNOSIS: Iron deficiency anemia secondary to menorrhagia.  PRIOR THERAPY: None  CURRENT THERAPY: Integra +1 capsule p.o. daily  INTERVAL HISTORY: Lori Perkins 51 y.o. female returns to the clinic today for 26-monthfollow-up visit.  The patient is feeling fine today with no concerning complaints except for mild fatigue.  She had her menstrual period last week and it was heavy at the beginning and painful.  She denied having any current chest pain, shortness of breath, cough or hemoptysis.  She has no nausea, vomiting, diarrhea or constipation.  She has no bleeding, bruises or ecchymosis.  She continues to tolerate her treatment with Integra plus capsule fairly well.  She will need refill of her medication.  She is here today for evaluation and repeat blood work.   MEDICAL HISTORY: Past Medical History:  Diagnosis Date   GERD (gastroesophageal reflux disease)    History of abnormal cervical Pap smear 2013   2013 Pap smear LSIL+, HPV not detected, 2014 Colposcopy with benign tissue    History of sexually transmitted disease 01/16/2014   BV, trichomonas    ALLERGIES:  is allergic to penicillins.  MEDICATIONS:  Current Outpatient Medications  Medication Sig Dispense Refill   acetaminophen (TYLENOL) 500 MG tablet Take 1,000 mg by mouth every 6 (six) hours as needed for mild pain.      amLODipine (NORVASC) 2.5 MG tablet Take 1 tablet (2.5 mg total) by mouth daily. 90 tablet 1   FeFum-FePoly-FA-B Cmp-C-Biot (INTEGRA PLUS) CAPS Take 1 capsule by mouth daily. 30 capsule 3   hydrOXYzine (ATARAX) 25 MG tablet Take 1 tablet (25 mg total) by mouth every 8 (eight) hours as needed. 60 tablet 1   polyethylene glycol powder (GLYCOLAX/MIRALAX) 17 GM/SCOOP powder Take 17 g by mouth daily. (Patient not taking: Reported on  02/16/2022) 3350 g 1   predniSONE (DELTASONE) 10 MG tablet 6,5,4,3,2,1 take if you have a severe flare of eczema.  Take each days dose all at once with food 21 tablet 0   triamcinolone (KENALOG) 0.025 % ointment Apply 1 Application topically 2 (two) times daily. 80 g 1   No current facility-administered medications for this visit.    SURGICAL HISTORY:  Past Surgical History:  Procedure Laterality Date   MULTIPLE TOOTH EXTRACTIONS      REVIEW OF SYSTEMS:  A comprehensive review of systems was negative except for: Constitutional: positive for fatigue   PHYSICAL EXAMINATION: General appearance: alert, cooperative, fatigued, and no distress Head: Normocephalic, without obvious abnormality, atraumatic Neck: no adenopathy, no JVD, supple, symmetrical, trachea midline, and thyroid not enlarged, symmetric, no tenderness/mass/nodules Lymph nodes: Cervical, supraclavicular, and axillary nodes normal. Resp: clear to auscultation bilaterally Back: symmetric, no curvature. ROM normal. No CVA tenderness. Cardio: regular rate and rhythm, S1, S2 normal, no murmur, click, rub or gallop GI: soft, non-tender; bowel sounds normal; no masses,  no organomegaly Extremities: extremities normal, atraumatic, no cyanosis or edema  ECOG PERFORMANCE STATUS: 0 - Asymptomatic  Blood pressure (!) 156/95, pulse 99, temperature 98.7 F (37.1 C), temperature source Oral, resp. rate 19, weight 123 lb 6 oz (56 kg), SpO2 100 %.  LABORATORY DATA: Lab Results  Component Value Date   WBC 9.5 03/09/2022   HGB 9.8 (L) 03/09/2022   HCT 31.7 (L) 03/09/2022   MCV 84.3 03/09/2022   PLT  434 (H) 03/09/2022      Chemistry      Component Value Date/Time   NA 138 08/26/2021 1116   NA 144 08/13/2021 1651   K 3.7 08/26/2021 1116   CL 106 08/26/2021 1116   CO2 23 08/26/2021 1116   BUN 13 08/26/2021 1116   BUN 8 08/13/2021 1651   CREATININE 0.85 08/26/2021 1116      Component Value Date/Time   CALCIUM 9.3 08/26/2021  1116   ALKPHOS 49 08/26/2021 1116   AST 9 (L) 08/26/2021 1116   ALT 5 08/26/2021 1116   BILITOT 0.3 08/26/2021 1116       RADIOGRAPHIC STUDIES: No results found.  ASSESSMENT AND PLAN: This is a very pleasant 51 years old African-American female with iron deficiency anemia secondary to menorrhagia.  The patient started treatment with Integra +1 capsule p.o. daily 3 months ago and has been tolerating her treatment fairly well. She is here today for evaluation and repeat blood work. She had her minister.  Last week and she lost a lot of blood.  CBC today showed drop of her hemoglobin down to 9.8 and hematocrit of 31.7%. Iron study and ferritin are still pending. I recommended for the patient to continue on Integra +1 capsule p.o. daily for now. If the pending iron studies showed significant iron deficiency, I will arrange for the patient to receive iron infusion at the [place] infusion center. I will see her back for follow-up visit in 3 months for evaluation and repeat blood work. For the hypertension, I strongly encouraged the patient to monitor her blood pressure closely at home and to discuss with her primary care physician for any adjustment of her medication if needed. The patient was advised to call immediately if she has any other concerning symptoms in the interval. The patient voices understanding of current disease status and treatment options and is in agreement with the current care plan.  All questions were answered. The patient knows to call the clinic with any problems, questions or concerns. We can certainly see the patient much sooner if necessary.  The total time spent in the appointment was 20 minutes.  Disclaimer: This note was dictated with voice recognition software. Similar sounding words can inadvertently be transcribed and may not be corrected upon review.

## 2022-03-09 NOTE — Telephone Encounter (Signed)
Auth Submission: NO AUTH NEEDED Payer: [employer] Medication & CPT/J Code(s) submitted: Venofer (Iron Sucrose) J1756 Route of submission (phone, fax, portal):  Phone # Fax # Auth type: Buy/Bill Units/visits requested: x3 Reference number:  Approval from: 03/09/22 to 02/28/23

## 2022-03-13 ENCOUNTER — Ambulatory Visit (INDEPENDENT_AMBULATORY_CARE_PROVIDER_SITE_OTHER): Payer: Commercial Managed Care - HMO

## 2022-03-13 VITALS — BP 138/82 | HR 72 | Temp 98.3°F | Resp 18 | Ht 67.0 in | Wt 126.2 lb

## 2022-03-13 DIAGNOSIS — D500 Iron deficiency anemia secondary to blood loss (chronic): Secondary | ICD-10-CM | POA: Diagnosis not present

## 2022-03-13 MED ORDER — ACETAMINOPHEN 325 MG PO TABS
650.0000 mg | ORAL_TABLET | Freq: Once | ORAL | Status: AC
  Administered 2022-03-13: 650 mg via ORAL
  Filled 2022-03-13: qty 2

## 2022-03-13 MED ORDER — SODIUM CHLORIDE 0.9 % IV SOLN
300.0000 mg | INTRAVENOUS | Status: DC
  Administered 2022-03-13: 300 mg via INTRAVENOUS
  Filled 2022-03-13: qty 15

## 2022-03-13 MED ORDER — DIPHENHYDRAMINE HCL 25 MG PO CAPS
25.0000 mg | ORAL_CAPSULE | Freq: Once | ORAL | Status: AC
  Administered 2022-03-13: 25 mg via ORAL
  Filled 2022-03-13: qty 1

## 2022-03-13 NOTE — Patient Instructions (Signed)

## 2022-03-13 NOTE — Progress Notes (Signed)
Diagnosis: Iron Deficiency Anemia  Provider:  Marshell Garfinkel MD  Procedure: Infusion  IV Type: Peripheral, IV Location: R Antecubital  Venofer (Iron Sucrose), Dose: 300 mg  Infusion Start Time: 2458  Infusion Stop Time: 1251  Post Infusion IV Care: Observation period completed and Peripheral IV Discontinued  Discharge: Condition: Good, Destination: Home . AVS provided to patient.   Performed by:  Cleophus Molt, RN

## 2022-03-20 ENCOUNTER — Ambulatory Visit (INDEPENDENT_AMBULATORY_CARE_PROVIDER_SITE_OTHER): Payer: Commercial Managed Care - HMO

## 2022-03-20 VITALS — BP 136/86 | HR 78 | Temp 98.0°F | Resp 18 | Ht 67.0 in | Wt 127.4 lb

## 2022-03-20 DIAGNOSIS — D500 Iron deficiency anemia secondary to blood loss (chronic): Secondary | ICD-10-CM | POA: Diagnosis not present

## 2022-03-20 MED ORDER — DIPHENHYDRAMINE HCL 25 MG PO CAPS
25.0000 mg | ORAL_CAPSULE | Freq: Once | ORAL | Status: AC
  Administered 2022-03-20: 25 mg via ORAL
  Filled 2022-03-20: qty 1

## 2022-03-20 MED ORDER — ACETAMINOPHEN 325 MG PO TABS
650.0000 mg | ORAL_TABLET | Freq: Once | ORAL | Status: AC
  Administered 2022-03-20: 650 mg via ORAL
  Filled 2022-03-20: qty 2

## 2022-03-20 MED ORDER — SODIUM CHLORIDE 0.9 % IV SOLN
300.0000 mg | INTRAVENOUS | Status: DC
  Administered 2022-03-20: 300 mg via INTRAVENOUS
  Filled 2022-03-20: qty 15

## 2022-03-20 NOTE — Progress Notes (Signed)
Diagnosis: Iron Deficiency Anemia  Provider:  Marshell Garfinkel MD  Procedure: Infusion  IV Type: Peripheral, IV Location: R Forearm  Venofer (Iron Sucrose), Dose: 300 mg  Infusion Start Time: 1107  Infusion Stop Time: 4132  Post Infusion IV Care: Peripheral IV Discontinued  Discharge: Condition: Good, Destination: Home . AVS provided to patient.   Performed by:  Cleophus Molt, RN

## 2022-03-27 ENCOUNTER — Ambulatory Visit (INDEPENDENT_AMBULATORY_CARE_PROVIDER_SITE_OTHER): Payer: Commercial Managed Care - HMO

## 2022-03-27 VITALS — BP 149/91 | HR 80 | Temp 97.6°F | Resp 18 | Ht 67.0 in | Wt 127.8 lb

## 2022-03-27 DIAGNOSIS — D500 Iron deficiency anemia secondary to blood loss (chronic): Secondary | ICD-10-CM | POA: Diagnosis not present

## 2022-03-27 MED ORDER — DIPHENHYDRAMINE HCL 25 MG PO CAPS
25.0000 mg | ORAL_CAPSULE | Freq: Once | ORAL | Status: AC
  Administered 2022-03-27: 25 mg via ORAL
  Filled 2022-03-27: qty 1

## 2022-03-27 MED ORDER — ACETAMINOPHEN 325 MG PO TABS: 650.0000 mg | ORAL_TABLET | Freq: Once | ORAL | Status: DC

## 2022-03-27 MED ORDER — SODIUM CHLORIDE 0.9 % IV SOLN
300.0000 mg | INTRAVENOUS | Status: DC
  Administered 2022-03-27: 300 mg via INTRAVENOUS
  Filled 2022-03-27: qty 15

## 2022-03-27 NOTE — Progress Notes (Signed)
Diagnosis: Iron Deficiency Anemia  Provider:  Marshell Garfinkel MD  Procedure: Infusion  IV Type: Peripheral, IV Location: R Antecubital  Venofer (Iron Sucrose), Dose: 300 mg  Infusion Start Time: 5320  Infusion Stop Time: 1250  Post Infusion IV Care: Peripheral IV Discontinued  Discharge: Condition: Good, Destination: Home . AVS provided to patient.   Performed by:  Koren Shiver, RN

## 2022-04-07 ENCOUNTER — Telehealth: Payer: Self-pay | Admitting: Family Medicine

## 2022-04-07 NOTE — Telephone Encounter (Signed)
Patient came in this afternoon regarding a letter she needs for DSS stating that she takes care of her father Mr. Catrena Vari she also said that the DSS told her she need a letter from doctor not a hand writing letter.

## 2022-04-08 NOTE — Telephone Encounter (Signed)
Pt is needing letter for her father

## 2022-04-08 NOTE — Telephone Encounter (Signed)
Pt checking status of letter for DSS  Pt requesting a cb w/ a status update

## 2022-04-09 NOTE — Telephone Encounter (Signed)
Sent to patient on MyChart.

## 2022-04-09 NOTE — Telephone Encounter (Signed)
Patient called in regarding if she can come pick up letter she needs fro DSS saying she takes care of her father. The deadline is Dec 1

## 2022-04-09 NOTE — Telephone Encounter (Signed)
Patient aware that letter is in [place]. Advised she can come to office if unable to print from home or email to [place].

## 2022-04-09 NOTE — Telephone Encounter (Signed)
Pt calling back to inquire about her letter.  She states the deadline is tomorrow.  She needs the letter today.  Please call and give an update.

## 2022-06-01 ENCOUNTER — Ambulatory Visit: Payer: Self-pay | Admitting: *Deleted

## 2022-06-01 ENCOUNTER — Other Ambulatory Visit: Payer: Self-pay | Admitting: *Deleted

## 2022-06-01 DIAGNOSIS — L308 Other specified dermatitis: Secondary | ICD-10-CM

## 2022-06-01 MED ORDER — TRIAMCINOLONE ACETONIDE 0.025 % EX OINT: 1.0000 | TOPICAL_OINTMENT | Freq: Two times a day (BID) | CUTANEOUS | 1 refills | Status: DC

## 2022-06-01 NOTE — Telephone Encounter (Signed)
  Chief Complaint: rash "all over". Requesting medication refills and appt. Symptoms: red rash all over and spreading. Patient did not give many details when asked. Concerned regarding medication refills . Reports swelling in ankles and hands. Frequency: itching / Rash since 05/29/22 and ankle swelling since 05/28/22 Pertinent Negatives: Patient denies chest pain no difficulty breathing no fever, no drainage.  Disposition: '[]'$ ED /'[x]'$ Urgent Care (no appt availability in office) / '[]'$ Appointment(In office/virtual)/ '[]'$  [place] [facility]/ '[]'$ Home Care/ '[]'$ Refused Recommended Disposition /'[]'$ [place] [facility]/ '[]'$  Follow-up with PCP Additional Notes:   Recommended UC due to no available appt. Patient requesting medication specific Rx # reports pharmacy gives her wrong medication. Please advise . Patient would like a call back .  Rx# [id number] triamcinolone 0.0 25% ointment.   .Reason for Disposition  SEVERE itching (i.e., interferes with sleep, normal activities or school)  Answer Assessment - Initial Assessment Questions 1. APPEARANCE of RASH: "Describe the rash." (e.g., spots, blisters, raised areas, skin peeling, scaly)     Red. And patient did not answer other questions regarding rash  2. SIZE: "How big are the spots?" (e.g., tip of pen, eraser, coin; inches, centimeters)     na 3. LOCATION: "Where is the rash located?"     All over 4. COLOR: "What color is the rash?" (Note: It is difficult to assess rash color in people with darker-colored skin. When this situation occurs, simply ask the caller to describe what they see.)     red 5. ONSET: "When did the rash begin?"     Friday 05/29/22  6. FEVER: "Do you have a fever?" If Yes, ask: "What is your temperature, how was it measured, and when did it start?"     Denies  7. ITCHING: "Does the rash itch?" If Yes, ask: "How bad is the itch?" (Scale 1-10; or mild, moderate, severe)     Yes itching  8. CAUSE: "What do you think is causing the  rash?"     Has been seen and treated before  9. MEDICINE FACTORS: "Have you started any new medicines within the last 2 weeks?" (e.g., antibiotics)      na 10. OTHER SYMPTOMS: "Do you have any other symptoms?" (e.g., dizziness, headache, sore throat, joint pain)       Swelling in ankles and hands "but not as tender" 11. PREGNANCY: "Is there any chance you are pregnant?" "When was your last menstrual period?"       na  Protocols used: Rash or Redness - [facility]

## 2022-06-01 NOTE — Telephone Encounter (Signed)
Medication has been refilled.

## 2022-06-01 NOTE — Telephone Encounter (Signed)
Patient requesting Rx # [id number]- triamcinolone 0.025% ointment.

## 2022-06-02 ENCOUNTER — Other Ambulatory Visit: Payer: Self-pay | Admitting: Pharmacist

## 2022-06-02 ENCOUNTER — Telehealth: Payer: Self-pay | Admitting: Family Medicine

## 2022-06-02 DIAGNOSIS — L308 Other specified dermatitis: Secondary | ICD-10-CM

## 2022-06-02 MED ORDER — HYDROXYZINE HCL 25 MG PO TABS: 25.0000 mg | ORAL_TABLET | Freq: Three times a day (TID) | ORAL | 1 refills | Status: DC | PRN

## 2022-06-02 NOTE — Telephone Encounter (Signed)
After 2 attempts to reorder Hydroxyzine, it keeps defaulting to print. Routing to the office for submission.

## 2022-06-02 NOTE — Addendum Note (Signed)
Addended by: Matilde Sprang on: 06/02/2022 09:50 AM   Modules accepted: Orders

## 2022-06-02 NOTE — Telephone Encounter (Signed)
Requested Prescriptions  Pending Prescriptions Disp Refills   hydrOXYzine (ATARAX) 25 MG tablet 60 tablet 1    Sig: Take 1 tablet (25 mg total) by mouth every 8 (eight) hours as needed.     Ear, Nose, and Throat:  Antihistamines 2 Passed - 06/01/2022  9:42 AM      Passed - Cr in normal range and within 360 days    Creatinine  Date Value Ref Range Status  08/26/2021 0.85 0.44 - 1.00 mg/dL Final         Passed - Valid encounter within last 12 months    Recent Outpatient Visits           3 months ago Other eczema   [place], Charlane Ferretti, MD   4 months ago Other [place], [place]   8 months ago Fibroids, intramural   [place], Charlane Ferretti, MD   9 months ago Other [place], [place]   2 years ago [place], [place], MD       Future Appointments             In 2 months Ralene Bathe, MD [place]   In 3 months Charlott Rakes, MD [place]

## 2022-06-02 NOTE — Addendum Note (Signed)
Addended by: Matilde Sprang on: 06/02/2022 09:47 AM   Modules accepted: Orders

## 2022-06-02 NOTE — Telephone Encounter (Signed)
Medication Refill - Medication: hydrOXYzine (ATARAX) 25 MG tablet ,naproxen (NAPROSYN) 500 MG tablet (pt is requesting medication for her menstrual cycle cramps)  Has the patient contacted their pharmacy? No. (Agent: If no, request that the patient contact the pharmacy for the refill. If patient does not wish to contact the pharmacy document the reason why and proceed with request.)   Preferred Pharmacy (with phone number or street name):  Has the patient been seen for an appointment in the last year OR does the p [id number] [place], [place] [address] [place] [address] Phone: [phone]: [phone] Hours: Open 24 hours  atient have an upcoming appointment? Yes.    Agent: Please be advised that RX refills may take up to 3 business days. We ask that you follow-up with your pharmacy.

## 2022-06-02 NOTE — Telephone Encounter (Signed)
VM left informing patient that mediation has been refilled

## 2022-06-02 NOTE — Telephone Encounter (Signed)
Hydroxyzine requested in another encounter on 06/01/22, unable to submit electronically, sent to the office for submission. Naproxen is not on current medication list, will route this to the office for approval.

## 2022-06-30 ENCOUNTER — Encounter: Payer: Self-pay | Admitting: Internal Medicine

## 2022-07-13 ENCOUNTER — Encounter: Payer: Self-pay | Admitting: Internal Medicine

## 2022-08-10 ENCOUNTER — Encounter: Payer: Self-pay | Admitting: Internal Medicine

## 2022-08-17 ENCOUNTER — Ambulatory Visit: Payer: Commercial Managed Care - HMO | Admitting: Dermatology

## 2022-08-18 ENCOUNTER — Ambulatory Visit: Payer: Commercial Managed Care - HMO | Admitting: Family Medicine

## 2022-08-19 ENCOUNTER — Ambulatory Visit: Payer: Self-pay | Admitting: *Deleted

## 2022-08-19 NOTE — Telephone Encounter (Signed)
Summary: under left breast swelling , discomfort and pus   Under Left breast swelling, discomfort, puss  Was offered a wait list appt yesterday but did not take it. Has an appt April 24,       Chief Complaint: Skin Sore Symptoms: Under left breast "Swollen and irritated", draining small amount yellowish drainage. Area "Small" 10/10 pain Frequency: "Maybe last week." Just started draining Pertinent Negatives: Patient denies fever Disposition: [] ED /[] Urgent Care (no appt availability in office) / [x] Appointment(In office/virtual)/ []  [facility]/ [] Home Care/ [] Refused Recommended Disposition /[] [facility]/ []  Follow-up with PCP Additional Notes: Pt evasive historian. Unsure of size, states no open area, "Swollen and now draining pus." Appt secured for tomorrow with Marylene Land. Care advise provided, pt verbalizes understanding.  Reason for Disposition  Looks like a boil or deep ulcer  Answer Assessment - Initial Assessment Questions 1. APPEARANCE of SORES: "What do the sores look like?"     Swollen 2. NUMBER: "How many sores are there?"     One 3. SIZE: "How big is the largest sore?"     "Not big" 4. LOCATION: "Where are the sores located?"     Under left beast 5. ONSET: "When did the sores begin?"     "Maybe last week." 6. TENDER: "Does it hurt when you touch it?"  (Scale 1-10; or mild, moderate, severe)      Yes,10/10 7. CAUSE: "What do you think is causing the sores?"     Unsure 8. OTHER SYMPTOMS: "Do you have any other symptoms?" (e.g., fever, new weakness)     Small amount yellow drainage  Protocols used: Sores-A-AH

## 2022-08-20 ENCOUNTER — Ambulatory Visit: Payer: Commercial Managed Care - HMO | Admitting: Physician Assistant

## 2022-08-20 ENCOUNTER — Ambulatory Visit: Payer: Self-pay | Admitting: *Deleted

## 2022-08-20 NOTE — Telephone Encounter (Signed)
Pt was called and informed to go to UC to get evaluated for her boil. She states that she will go to UC.

## 2022-08-20 NOTE — Telephone Encounter (Signed)
  Chief Complaint: boil-draining  Symptoms: boil- left upper rib cage- quarter size, draining yellow drainage, painful Frequency: present over 3 weeks-has initially drained- this is second time- possibly irritated by bra Pertinent Negatives: Patient denies fever Disposition: [] ED /[] Urgent Care (no appt availability in office) / [x] Appointment(In office/virtual)/ []  [facility]/ [] Home Care/ [] Refused Recommended Disposition /[] [facility]/ []  Follow-up with PCP Additional Notes: Patient has appointment scheduled(she missed her appointment today)- but needs sooner appointment per disposition- patient is requesting to be seen sooner- message sent to [facility] to see if patient can be scheduled sooner

## 2022-08-20 NOTE — Telephone Encounter (Signed)
Summary: skin soreness. Under the left breast /   Pt called in and stated she missed her appointment today with Marylene Land for skin soreness. Under the left breast,  draining a small amount of yellowish drainage. Pt stated she would keep the appointment for 04/24 with Dr. Alvis Lemmings; she just needs clinical advice to get her through until then.  I advised pt she could still come in today "You'll arrive after the scheduled time and will have to wait. We can't guarantee immediate attention but our Providers will try to fit you in if there is a no show."  Pt declined.   Seeking clinical advice.       Reason for Disposition  [1] Spreading redness around the boil AND [2] no fever  Answer Assessment - Initial Assessment Questions 1. APPEARANCE of BOIL: "What does the boil look like?"      Red raised area- swollen- quarter size 2. LOCATION: "Where is the boil located?"      Left upper rib cage 3. NUMBER: "How many boils are there?"      1 4. SIZE: "How big is the boil?" (e.g., inches, cm; compare to size of a coin or other object)     Quarter size 5. ONSET: "When did the boil start?"     3 weeks 6. PAIN: "Is there any pain?" If Yes, ask: "How bad is the pain?"   (Scale 1-10; or mild, moderate, severe)     yes 7. FEVER: "Do you have a fever?" If Yes, ask: "What is it, how was it measured, and when did it start?"      no 8. SOURCE: "Have you been around anyone with boils or other Staph infections?" "Have you ever had boils before?"     no 9. OTHER SYMPTOMS: "Do you have any other symptoms?" (e.g., shaking chills, weakness, rash elsewhere on body)     no  Protocols used: Boil (Skin Abscess)-A-AH

## 2022-08-26 ENCOUNTER — Ambulatory Visit
Admission: EM | Admit: 2022-08-26 | Discharge: 2022-08-26 | Disposition: A | Discharge status: Home / Self Care | Payer: Commercial Managed Care - HMO | Attending: Family Medicine | Admitting: Family Medicine

## 2022-08-26 DIAGNOSIS — L089 Local infection of the skin and subcutaneous tissue, unspecified: Secondary | ICD-10-CM

## 2022-08-26 DIAGNOSIS — B9689 Other specified bacterial agents as the cause of diseases classified elsewhere: Secondary | ICD-10-CM

## 2022-08-26 DIAGNOSIS — R030 Elevated blood-pressure reading, without diagnosis of hypertension: Secondary | ICD-10-CM

## 2022-08-26 DIAGNOSIS — L98491 Non-pressure chronic ulcer of skin of other sites limited to breakdown of skin: Secondary | ICD-10-CM

## 2022-08-26 MED ORDER — DOXYCYCLINE HYCLATE 100 MG PO CAPS: 100.0000 mg | ORAL_CAPSULE | Freq: Two times a day (BID) | ORAL | 0 refills | Status: DC

## 2022-08-26 MED ORDER — MUPIROCIN 2 % EX OINT: 1.0000 | TOPICAL_OINTMENT | Freq: Two times a day (BID) | CUTANEOUS | 0 refills | Status: AC

## 2022-08-26 NOTE — ED Provider Notes (Signed)
[facility] CARE CENTER   [id number] 08/26/22 Arrival Time: 4098  ASSESSMENT & PLAN:  1. Localized bacterial skin infection   2. Skin ulcer, limited to breakdown of skin   3. Elevated blood pressure reading without diagnosis of hypertension    If there was any collection of pus present, it appears to have drained with a current superficial ulceration of skin. Wound care inst provided.  Begin: Meds ordered this encounter  Medications   doxycycline (VIBRAMYCIN) 100 MG capsule    Sig: Take 1 capsule (100 mg total) by mouth 2 (two) times daily.    Dispense:  20 capsule    Refill:  0   mupirocin ointment (BACTROBAN) 2 %    Sig: Apply 1 Application topically 2 (two) times daily.    Dispense:  22 g    Refill:  0    Follow-up Information      Urgent Care at [facility]).   Specialty: Urgent Care Why: For wound re-check in one week. Contact information: [address] [id number] mc [place] [id number] [phone]        Schedule an appointment as soon as possible for a visit  with Hoy Register, MD.   Specialty: Family Medicine Why: To recheck your blood pressure. Contact information: [address] [place] [address] [place] [address] [phone]                   Discharge Instructions      Your blood pressure was noted to be elevated during your visit today. If you are currently taking medication for high blood pressure, please ensure you are taking this as directed. If you do not have a history of high blood pressure and your blood pressure remains persistently elevated, you may need to begin taking a medication at some point. You may return here within the next few days to recheck if unable to see your primary care provider or if you do not have a one.  BP (!) 175/65 (BP Location: Left Arm)   Pulse 82   Temp 97.8 F (36.6 C) (Oral)   Resp 15   SpO2 100%   BP Readings from Last 3 Encounters:   08/26/22 (!) 175/65  03/27/22 (!) 149/91  03/20/22 136/86   Reviewed expectations re: course of current medical issues. Questions answered. Outlined signs and symptoms indicating need for more acute intervention. Patient verbalized understanding. After Visit Summary given.   SUBJECTIVE:  Lori Perkins is a 52 y.o. female who presents with a possible infection of her skin under LEFT breast. Noted approx 2 w ago; has drained. Denies fever. Initially used H2O2 on wound. Denies bleeding. Increased blood pressure noted today. Reports that she is not treated for HTN. She reports no chest pain on exertion, no dyspnea on exertion, no swelling of ankles, no orthostatic dizziness or lightheadedness, no orthopnea or paroxysmal nocturnal dyspnea, and no palpitations.   OBJECTIVE:  Vitals:   08/26/22 1016  BP: (!) 175/65  Pulse: 82  Resp: 15  Temp: 97.8 F (36.6 C)  TempSrc: Oral  SpO2: 100%    General appearance: alert; no distress Chest wall: (RN present during exam); approx 0.5 x 1 cm ulceration of skin under LEFT breast; no bleeding or draingage; is TTP Psychological: alert and cooperative; normal mood and affect  Allergies  Allergen Reactions   Penicillins     Has patient had a PCN reaction causing immediate rash, facial/tongue/throat swelling, SOB or lightheadedness with hypotension: Unknown  Has patient had a PCN reaction causing severe rash involving mucus membranes or skin necrosis: Unknown Has patient had a PCN reaction that required hospitalization: Unknown Has patient had a PCN reaction occurring within the last 10 years: No If all of the above answers are "NO", then may proceed with Cephalosporin use.    Past Medical History:  Diagnosis Date   GERD (gastroesophageal reflux disease)    History of abnormal cervical Pap smear 2013   2013 Pap smear LSIL+, HPV not detected, 2014 Colposcopy with benign tissue    History of sexually transmitted disease 01/16/2014    BV, trichomonas   Social History   Socioeconomic History   Marital status: Single    Spouse name: Not on file   Number of children: Not on file   Years of education: Not on file   Highest education level: Some college, no degree  Occupational History   Not on file  Tobacco Use   Smoking status: Every Day    Packs/day: 0.50    Years: 20.00    Additional pack years: 0.00    Total pack years: 10.00    Types: Cigarettes   Smokeless tobacco: Never  Vaping Use   Vaping Use: Former  Substance and Sexual Activity   Alcohol use: Yes    Comment: occasionally   Drug use: Not Currently    Types: Marijuana   Sexual activity: Yes    Birth control/protection: Condom  Other Topics Concern   Not on file  Social History Narrative   Not on file   Social Determinants of Health   Financial Resource Strain: Not on file  Food Insecurity: Not on file  Transportation Needs: No Transportation Needs (06/01/2019)   PRAPARE - Administrator, Civil Service (Medical): No    Lack of Transportation (Non-Medical): No  Physical Activity: Not on file  Stress: Not on file  Social Connections: Not on file   Family History  Problem Relation Age of Onset   Diabetes Mother    Hypertension Father    Stroke Father    Cancer Maternal Aunt    Breast cancer Maternal Aunt 110   Cancer Maternal Uncle    Diabetes Maternal Grandmother    Breast cancer Maternal Grandmother 61   Past Surgical History:  Procedure Laterality Date   MULTIPLE TOOTH Vance Peper, MD 08/26/22 1047

## 2022-08-26 NOTE — ED Triage Notes (Signed)
Pt presents with open abscess under left breast X 2 weeks that is draining.

## 2022-08-26 NOTE — Discharge Instructions (Addendum)
Your blood pressure was noted to be elevated during your visit today. If you are currently taking medication for high blood pressure, please ensure you are taking this as directed. If you do not have a history of high blood pressure and your blood pressure remains persistently elevated, you may need to begin taking a medication at some point. You may return here within the next few days to recheck if unable to see your primary care provider or if you do not have a one.  BP (!) 175/65 (BP Location: Left Arm)   Pulse 82   Temp 97.8 F (36.6 C) (Oral)   Resp 15   SpO2 100%   BP Readings from Last 3 Encounters:  08/26/22 (!) 175/65  03/27/22 (!) 149/91  03/20/22 136/86

## 2022-08-27 ENCOUNTER — Ambulatory Visit: Payer: Medicaid Other | Admitting: Dermatology

## 2022-09-02 ENCOUNTER — Encounter: Payer: Self-pay | Admitting: Family Medicine

## 2022-09-02 ENCOUNTER — Ambulatory Visit: Payer: Commercial Managed Care - HMO | Attending: Family Medicine | Admitting: Family Medicine

## 2022-09-02 VITALS — BP 137/82 | HR 94 | Ht 67.0 in | Wt 127.6 lb

## 2022-09-02 DIAGNOSIS — L089 Local infection of the skin and subcutaneous tissue, unspecified: Secondary | ICD-10-CM | POA: Diagnosis not present

## 2022-09-02 DIAGNOSIS — I10 Essential (primary) hypertension: Secondary | ICD-10-CM

## 2022-09-02 DIAGNOSIS — N946 Dysmenorrhea, unspecified: Secondary | ICD-10-CM

## 2022-09-02 DIAGNOSIS — L308 Other specified dermatitis: Secondary | ICD-10-CM

## 2022-09-02 DIAGNOSIS — B9689 Other specified bacterial agents as the cause of diseases classified elsewhere: Secondary | ICD-10-CM

## 2022-09-02 MED ORDER — NAPROXEN 500 MG PO TABS
500.0000 mg | ORAL_TABLET | Freq: Two times a day (BID) | ORAL | 1 refills | Status: DC
Start: 2022-09-02 — End: 2023-03-10

## 2022-09-02 NOTE — Progress Notes (Addendum)
Subjective:  Patient ID: Lori Perkins, female    DOB: 10-20-1970  Age: 52 y.o. MRN: [id number]  CC: Hypertension   HPI Lori Perkins is a 52 y.o. year old female with a history of abnormal uterine bleed secondary to uterine fibroids with associated anemia, eczema.   Interval History:   Her BP is elevated and she is not taking Amlodipine though it appears on her med list.  Her BP has been elevated over the last 3 visits. Smoking half a ppd and is not ready to quit at this time, declines pharmacotherapeutic interventions but states that when she quits she will quit cold [place].  She had an ED visit where she was treated for a localized bacterial skin infection beneath her left breast, placed on doxycycline.  Symptoms have improved and over the last 2 days she has not had any drainage. She is requesting refill of naproxen which she uses for menorrhea. 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 Her eczema has been flaring up and she is planning to contact her dermatologist for an appointment.  I had previously referred her but it was all the way in [place] and she did not make it there.  Past Medical History:  Diagnosis Date   GERD (gastroesophageal reflux disease)    History of abnormal cervical Pap smear 2013   2013 Pap smear LSIL+, HPV not detected, 2014 Colposcopy with benign tissue    History of sexually transmitted disease 01/16/2014   BV, trichomonas    Past Surgical History:  Procedure Laterality Date   MULTIPLE TOOTH EXTRACTIONS      Family History  Problem Relation Age of Onset   Diabetes Mother    Hypertension Father    Stroke Father    Cancer Maternal Aunt    Breast cancer Maternal Aunt 63   Cancer Maternal Uncle    Diabetes Maternal Grandmother    Breast cancer Maternal Grandmother 63    Social History   Socioeconomic History   Marital status: Single    Spouse name: Not on file   Number  of children: Not on file   Years of education: Not on file   Highest education level: Some college, no degree  Occupational History   Not on file  Tobacco Use   Smoking status: Every Day    Packs/day: 0.50    Years: 20.00    Additional pack years: 0.00    Total pack years: 10.00    Types: Cigarettes   Smokeless tobacco: Never  Vaping Use   Vaping Use: Former  Substance and Sexual Activity   Alcohol use: Yes    Comment: occasionally   Drug use: Not Currently    Types: Marijuana   Sexual activity: Yes    Birth control/protection: Condom  Other Topics Concern   Not on file  Social History Narrative   Not on file   Social Determinants of Health   Financial Resource Strain: Not on file  Food Insecurity: Not on file  Transportation Needs: No Transportation Needs (06/01/2019)   PRAPARE - Administrator, Civil Service (Medical): No    Lack of Transportation (Non-Medical): No  Physical Activity: Not on file  Stress: Not on file  Social Connections: Not on file    Allergies  Allergen Reactions   Penicillins     Has patient had a PCN reaction causing immediate rash, facial/tongue/throat swelling, SOB or lightheadedness with hypotension: Unknown Has  patient had a PCN reaction causing severe rash involving mucus membranes or skin necrosis: Unknown Has patient had a PCN reaction that required hospitalization: Unknown Has patient had a PCN reaction occurring within the last 10 years: No If all of the above answers are "NO", then may proceed with Cephalosporin use.    Outpatient Medications Prior to Visit  Medication Sig Dispense Refill   acetaminophen (TYLENOL) 500 MG tablet Take 1,000 mg by mouth every 6 (six) hours as needed for mild pain.      amLODipine (NORVASC) 2.5 MG tablet Take 1 tablet (2.5 mg total) by mouth daily. 90 tablet 1   doxycycline (VIBRAMYCIN) 100 MG capsule Take 1 capsule (100 mg total) by mouth 2 (two) times daily. 20 capsule 0    FeFum-FePoly-FA-B Cmp-C-Biot (INTEGRA PLUS) CAPS Take 1 capsule by mouth daily. 30 capsule 3   hydrOXYzine (ATARAX) 25 MG tablet Take 1 tablet (25 mg total) by mouth every 8 (eight) hours as needed. 60 tablet 1   mupirocin ointment (BACTROBAN) 2 % Apply 1 Application topically 2 (two) times daily. 22 g 0   polyethylene glycol powder (GLYCOLAX/MIRALAX) 17 GM/SCOOP powder Take 17 g by mouth daily. (Patient not taking: Reported on 02/16/2022) 3350 g 1   triamcinolone (KENALOG) 0.025 % ointment Apply 1 Application topically 2 (two) times daily. 80 g 1   No facility-administered medications prior to visit.     ROS Review of Systems  Constitutional:  Negative for activity change and appetite change.  HENT:  Negative for sinus pressure and sore throat.   Respiratory:  Negative for chest tightness, shortness of breath and wheezing.   Cardiovascular:  Negative for chest pain and palpitations.  Gastrointestinal:  Negative for abdominal distention, abdominal pain and constipation.  Genitourinary: Negative.   Musculoskeletal: Negative.   Skin:  Positive for wound.  Psychiatric/Behavioral:  Negative for behavioral problems and dysphoric mood.     Objective:  BP 137/82   Pulse 94   Ht  (1.702 m)   Wt 127 lb 9.6 oz (57.9 kg)   SpO2 100%   BMI 19.98 kg/m      09/02/2022    3:41 PM 09/02/2022    3:18 PM 08/26/2022   10:16 AM  BP/Weight  Systolic BP 137 171 175  Diastolic BP 82 92 65  Wt. (Lbs)  127.6   BMI  19.98 kg/m2       Physical Exam Constitutional:      Appearance: She is well-developed.  Cardiovascular:     Rate and Rhythm: Normal rate.     Heart sounds: Normal heart sounds. No murmur heard. Pulmonary:     Effort: Pulmonary effort is normal.     Breath sounds: Normal breath sounds. No wheezing or rales.  Chest:     Chest wall: No tenderness.  Abdominal:     General: Bowel sounds are normal. There is no distension.     Palpations: Abdomen is soft. There is no mass.      Tenderness: There is no abdominal tenderness.  Musculoskeletal:        General: Normal range of motion.     Right lower leg: No edema.     Left lower leg: No edema.  Skin:    Comments: Well circumscribed skin ulcer beneath left breast with no discharge Eczematous patches distributed in different areas of the body  Neurological:     Mental Status: She is alert and oriented to person, place, and time.  Psychiatric:  Mood and Affect: Mood normal.        Latest Ref Rng & Units 08/26/2021   11:16 AM 08/14/2021    3:40 PM 08/13/2021    4:51 PM  CMP  Glucose 70 - 99 mg/dL 161  096  84   BUN 6 - 20 mg/dL 13  10  8    Creatinine 0.44 - 1.00 mg/dL 0.45  4.09  8.11   Sodium 135 - 145 mmol/L 138  140  144   Potassium 3.5 - 5.1 mmol/L 3.7  3.4  3.9   Chloride 98 - 111 mmol/L 106  108  107   CO2 22 - 32 mmol/L 23  26  21    Calcium 8.9 - 10.3 mg/dL 9.3  9.3  9.3   Total Protein 6.5 - 8.1 g/dL 7.6  7.9  6.9   Total Bilirubin 0.3 - 1.2 mg/dL 0.3  0.1  <9.1   Alkaline Phos 38 - 126 U/L 49  51  60   AST 15 - 41 U/L 9  13  12    ALT 0 - 44 U/L 5  7  7      Lipid Panel     Component Value Date/Time   CHOL 149 05/17/2015 1141   TRIG 37 05/17/2015 1141   HDL 65 05/17/2015 1141   CHOLHDL 2.3 05/17/2015 1141   LDLCALC 77 05/17/2015 1141    CBC    Component Value Date/Time   WBC 9.5 03/09/2022 1121   WBC 14.5 (H) 08/14/2021 1540   RBC 3.76 (L) 03/09/2022 1121   HGB 9.8 (L) 03/09/2022 1121   HGB 6.4 (LL) 08/13/2021 1651   HCT 31.7 (L) 03/09/2022 1121   HCT 23.8 (L) 08/13/2021 1651   PLT 434 (H) 03/09/2022 1121   PLT 474 (H) 08/13/2021 1651   MCV 84.3 03/09/2022 1121   MCV 71 (L) 08/13/2021 1651   MCH 26.1 03/09/2022 1121   MCHC 30.9 03/09/2022 1121   RDW 17.2 (H) 03/09/2022 1121   RDW 18.0 (H) 08/13/2021 1651   LYMPHSABS 2.2 03/09/2022 1121   LYMPHSABS 2.6 08/13/2021 1651   MONOABS 1.0 03/09/2022 1121   EOSABS 0.3 03/09/2022 1121   EOSABS 0.3 08/13/2021 1651   BASOSABS 0.1  03/09/2022 1121   BASOSABS 0.1 08/13/2021 1651    Lab Results  Component Value Date   HGBA1C 5.6 02/16/2022     Assessment & Plan:  1. Primary hypertension Initial blood pressure was severely elevated but repeat came back at 137/82 She has not been taking amlodipine and endorses smoking just before this visit Continue to work on monitoring blood pressures at home Will reassess at next visit Counseled on blood pressure goal of less than 130/80, low-sodium, DASH diet, medication compliance, 150 minutes of moderate intensity exercise per week. Discussed medication compliance, adverse effects.  2. Localized bacterial skin infection Continue doxycycline Discharge. Apply warm compress  3. Dysmenorrhea Secondary to uterine fibroids Anemia screening managed by hematology - naproxen (NAPROSYN) 500 MG tablet; Take 1 tablet (500 mg total) by mouth 2 (two) times daily with a meal.  Dispense: 60 tablet; Refill: 1  4. Other eczema Uncontrolled with frequent flares She will be setting up an appointment with dermatology.    Meds ordered this encounter  Medications   naproxen (NAPROSYN) 500 MG tablet    Sig: Take 1 tablet (500 mg total) by mouth 2 (two) times daily with a meal.    Dispense:  60 tablet    Refill:  1  Follow-up: Return in about 6 months (around 03/04/2023).       Hoy Register, MD, FAAFP. [facility] [place], [place] [phone]   09/02/2022, 3:50 PM

## 2022-09-02 NOTE — Patient Instructions (Signed)

## 2022-09-02 NOTE — Progress Notes (Signed)
Discuss urgent care visit Swelling in legs and ankles

## 2022-09-14 ENCOUNTER — Other Ambulatory Visit: Payer: Self-pay | Admitting: Family Medicine

## 2022-09-14 DIAGNOSIS — L308 Other specified dermatitis: Secondary | ICD-10-CM

## 2022-09-14 MED ORDER — HYDROXYZINE HCL 25 MG PO TABS
25.0000 mg | ORAL_TABLET | Freq: Three times a day (TID) | ORAL | 0 refills | Status: DC | PRN
Start: 2022-09-14 — End: 2023-03-10

## 2022-09-14 NOTE — Telephone Encounter (Signed)
Medication Refill - Medication: hydrOXYzine (ATARAX) 25 MG tablet  Has the patient contacted their pharmacy? Yes.    Pt states PCP forgot to call this medication in last week with her other medications. When pt went to the pharmacy for pick up this was the only one left off.      Preferred Pharmacy (with phone number or street name):  [id number] [place], [place] - [address] AT [place] [address] Phone: [phone]  Fax: [phone]     Has the patient been seen for an appointment in the last year OR does the patient have an upcoming appointment? Yes.    Agent: Please be advised that RX refills may take up to 3 business days. We ask that you follow-up with your pharmacy.

## 2022-09-14 NOTE — Telephone Encounter (Signed)
Requested medication (s) are due for refill today: Yes  Requested medication (s) are on the active medication list: Yes  Last refill:  05/23/22  Future visit scheduled: Yes  Notes to clinic:  Unable to refill per protocol due to failed labs, no updated results.      Requested Prescriptions  Pending Prescriptions Disp Refills   hydrOXYzine (ATARAX) 25 MG tablet 60 tablet 1    Sig: Take 1 tablet (25 mg total) by mouth every 8 (eight) hours as needed.     Ear, Nose, and Throat:  Antihistamines 2 Failed - 09/14/2022 10:19 AM      Failed - Cr in normal range and within 360 days    Creatinine  Date Value Ref Range Status  08/26/2021 0.85 0.44 - 1.00 mg/dL Final         Passed - Valid encounter within last 12 months    Recent Outpatient Visits           1 week ago Localized bacterial skin infection   [facility] [place], Odette Horns, MD   7 months ago Other eczema   [facility] Hoy Register, MD   8 months ago Other eczema   [facility] [place], Marylene Land [place], [place]   11 months ago Fibroids, intramural   [facility] [place], Odette Horns, MD   1 year ago Other eczema   [facility] Hoy Register, MD       Future Appointments             In 5 months Hoy Register, MD [facility]

## 2023-02-12 ENCOUNTER — Other Ambulatory Visit: Payer: Self-pay | Admitting: Family Medicine

## 2023-02-12 DIAGNOSIS — Z1211 Encounter for screening for malignant neoplasm of colon: Secondary | ICD-10-CM

## 2023-02-12 DIAGNOSIS — Z1212 Encounter for screening for malignant neoplasm of rectum: Secondary | ICD-10-CM

## 2023-03-10 ENCOUNTER — Ambulatory Visit: Payer: Commercial Managed Care - HMO | Attending: Family Medicine | Admitting: Family Medicine

## 2023-03-10 ENCOUNTER — Encounter: Payer: Self-pay | Admitting: Family Medicine

## 2023-03-10 VITALS — BP 104/61 | HR 87 | Resp 16 | Wt 131.8 lb

## 2023-03-10 DIAGNOSIS — Z2821 Immunization not carried out because of patient refusal: Secondary | ICD-10-CM

## 2023-03-10 DIAGNOSIS — D251 Intramural leiomyoma of uterus: Secondary | ICD-10-CM | POA: Diagnosis not present

## 2023-03-10 DIAGNOSIS — N946 Dysmenorrhea, unspecified: Secondary | ICD-10-CM | POA: Diagnosis not present

## 2023-03-10 DIAGNOSIS — L308 Other specified dermatitis: Secondary | ICD-10-CM | POA: Diagnosis not present

## 2023-03-10 DIAGNOSIS — Z1231 Encounter for screening mammogram for malignant neoplasm of breast: Secondary | ICD-10-CM

## 2023-03-10 MED ORDER — HYDROXYZINE HCL 25 MG PO TABS
25.0000 mg | ORAL_TABLET | Freq: Three times a day (TID) | ORAL | 1 refills | Status: DC | PRN
Start: 2023-03-10 — End: 2023-08-03

## 2023-03-10 MED ORDER — NAPROXEN 500 MG PO TABS: 500.0000 mg | ORAL_TABLET | Freq: Two times a day (BID) | ORAL | 2 refills | Status: DC

## 2023-03-10 NOTE — Patient Instructions (Signed)
VISIT SUMMARY:  During today's visit, we discussed your menstrual cramps and heavy bleeding, eczema, asthma, and smoking cessation. We also reviewed your general health maintenance, including upcoming screenings and lab work.  YOUR PLAN:  -MENORRHAGIA: Menorrhagia means heavy menstrual bleeding. You have been experiencing significant cramping and a second menstrual cycle in October. We have refilled your Naproxen prescription to help manage the cramps and ordered blood work to check your hemoglobin and iron levels due to your history of anemia.  -ECZEMA: Eczema is a condition that makes your skin red and itchy. You mentioned that your hands are acting up. We have referred you to a dermatologist in [place] to help manage your eczema.  -ASTHMA: Asthma is a condition in which your airways narrow and swell, causing breathing difficulties. You reported some symptoms, so we will continue with your current management plan and monitor your symptoms.  -SMOKING: Smoking cessation is important for your overall health. You expressed readiness to quit smoking, and we encourage you to continue your efforts to quit on your own.  -GENERAL HEALTH MAINTENANCE: We have ordered a Cologuard test for colon cancer screening, which has already been sent to your home. We also ordered a mammogram for breast cancer screening and basic labs to monitor your kidney function due to Naproxen use.  INSTRUCTIONS:  Please follow up with your hematologist for your anemia as scheduled. Complete the Cologuard test at home and schedule your mammogram. Make an appointment with the dermatologist in [facility] for your eczema. Continue taking Naproxen as prescribed for menstrual cramps and monitor your asthma symptoms. If you have any questions or concerns, please contact our office.

## 2023-03-10 NOTE — Progress Notes (Signed)
Subjective:  Patient ID: Lori Perkins, female    DOB: 01-31-1971  Age: 52 y.o. MRN: [id number]  CC: Dysmenorrhea   HPI Lori Perkins is a 52 y.o. year old female with a history of abnormal uterine bleed secondary to uterine fibroids with associated anemia, eczema.   Interval History: Discussed the use of AI scribe software for clinical note transcription with the patient, who gave verbal consent to proceed.  She presents with menstrual cramps and heavy bleeding. She reports having a second menstrual cycle this month and has been experiencing cramps even when not bleeding. She has been taking naproxen for the cramps but has run out. She also reports cramping in her legs and arms over the past week or two.  The patient has been seeing a hematologist for her anemia and is due for a follow-up appointment. She is not currently taking iron pills.  She also complains of breaking out in eczematous rash in her hands. She has been taking hydroxyzine for itching and has a few pills left. She has not yet seen a dermatologist for her eczema.  I had referred her previously but she did not attend the appointment due to the fact that she did not have transportation to go to [place].  The patient is a smoker and is trying to quit on her own. She is due for a mammogram and a screening for colon cancer. She has chosen to do the Cologuard test for colon cancer screening.        Past Medical History:  Diagnosis Date   GERD (gastroesophageal reflux disease)    History of abnormal cervical Pap smear 2013   2013 Pap smear LSIL+, HPV not detected, 2014 Colposcopy with benign tissue    History of sexually transmitted disease 01/16/2014   BV, trichomonas    Past Surgical History:  Procedure Laterality Date   MULTIPLE TOOTH EXTRACTIONS      Family History  Problem Relation Age of Onset   Diabetes Mother    Hypertension Father    Stroke Father    Cancer Maternal Aunt    Breast  cancer Maternal Aunt 23   Cancer Maternal Uncle    Diabetes Maternal Grandmother    Breast cancer Maternal Grandmother 57    Social History   Socioeconomic History   Marital status: Single    Spouse name: Not on file   Number of children: Not on file   Years of education: Not on file   Highest education level: Some college, no degree  Occupational History   Not on file  Tobacco Use   Smoking status: Every Day    Current packs/day: 0.50    Average packs/day: 0.5 packs/day for 20.0 years (10.0 ttl pk-yrs)    Types: Cigarettes   Smokeless tobacco: Never  Vaping Use   Vaping status: Former  Substance and Sexual Activity   Alcohol use: Yes    Comment: occasionally   Drug use: Not Currently    Types: Marijuana   Sexual activity: Yes    Birth control/protection: Condom  Other Topics Concern   Not on file  Social History Narrative   Not on file   Social Determinants of Health   Financial Resource Strain: Not on file  Food Insecurity: Not on file  Transportation Needs: No Transportation Needs (06/01/2019)   PRAPARE - Administrator, Civil Service (Medical): No    Lack of Transportation (Non-Medical): No  Physical Activity: Not on file  Stress: Not  on file  Social Connections: Unknown (09/23/2021)   [facility]   Social Network    Social Network: Not on file    Allergies  Allergen Reactions   Penicillins     Has patient had a PCN reaction causing immediate rash, facial/tongue/throat swelling, SOB or lightheadedness with hypotension: Unknown Has patient had a PCN reaction causing severe rash involving mucus membranes or skin necrosis: Unknown Has patient had a PCN reaction that required hospitalization: Unknown Has patient had a PCN reaction occurring within the last 10 years: No If all of the above answers are "NO", then may proceed with Cephalosporin use.    Outpatient Medications Prior to Visit  Medication Sig Dispense  Refill   acetaminophen (TYLENOL) 500 MG tablet Take 1,000 mg by mouth every 6 (six) hours as needed for mild pain.  (Patient not taking: Reported on 03/10/2023)     amLODipine (NORVASC) 2.5 MG tablet Take 1 tablet (2.5 mg total) by mouth daily. (Patient not taking: Reported on 03/10/2023) 90 tablet 1   doxycycline (VIBRAMYCIN) 100 MG capsule Take 1 capsule (100 mg total) by mouth 2 (two) times daily. (Patient not taking: Reported on 03/10/2023) 20 capsule 0   FeFum-FePoly-FA-B Cmp-C-Biot (INTEGRA PLUS) CAPS Take 1 capsule by mouth daily. (Patient not taking: Reported on 03/10/2023) 30 capsule 3   mupirocin ointment (BACTROBAN) 2 % Apply 1 Application topically 2 (two) times daily. (Patient not taking: Reported on 03/10/2023) 22 g 0   polyethylene glycol powder (GLYCOLAX/MIRALAX) 17 GM/SCOOP powder Take 17 g by mouth daily. (Patient not taking: Reported on 02/16/2022) 3350 g 1   triamcinolone (KENALOG) 0.025 % ointment Apply 1 Application topically 2 (two) times daily. (Patient not taking: Reported on 03/10/2023) 80 g 1   hydrOXYzine (ATARAX) 25 MG tablet Take 1 tablet (25 mg total) by mouth every 8 (eight) hours as needed. (Patient not taking: Reported on 03/10/2023) 270 tablet 0   naproxen (NAPROSYN) 500 MG tablet Take 1 tablet (500 mg total) by mouth 2 (two) times daily with a meal. (Patient not taking: Reported on 03/10/2023) 60 tablet 1   No facility-administered medications prior to visit.     ROS Review of Systems  Constitutional:  Negative for activity change and appetite change.  HENT:  Negative for sinus pressure and sore throat.   Respiratory:  Negative for chest tightness, shortness of breath and wheezing.   Cardiovascular:  Negative for chest pain and palpitations.  Gastrointestinal:  Negative for abdominal distention, abdominal pain and constipation.  Genitourinary: Negative.   Musculoskeletal: Negative.   Skin:  Positive for rash.  Psychiatric/Behavioral:  Negative for  behavioral problems and dysphoric mood.     Objective:  BP 104/61   Pulse 87   Resp 16   Wt 131 lb 12.8 oz (59.8 kg)   SpO2 97%   BMI 20.64 kg/m      03/10/2023    3:19 PM 09/02/2022    3:41 PM 09/02/2022    3:18 PM  BP/Weight  Systolic BP 104 137 171  Diastolic BP 61 82 92  Wt. (Lbs) 131.8  127.6  BMI 20.64 kg/m2  19.98 kg/m2      Physical Exam Constitutional:      Appearance: She is well-developed.  Cardiovascular:     Rate and Rhythm: Normal rate.     Heart sounds: Normal heart sounds. No murmur heard. Pulmonary:     Effort: Pulmonary effort is normal.     Breath sounds: Normal breath  sounds. No wheezing or rales.  Chest:     Chest wall: No tenderness.  Abdominal:     General: Bowel sounds are normal. There is no distension.     Palpations: Abdomen is soft. There is no mass.     Tenderness: There is no abdominal tenderness.  Musculoskeletal:        General: Normal range of motion.     Right lower leg: No edema.     Left lower leg: No edema.  Neurological:     Mental Status: She is alert and oriented to person, place, and time.  Psychiatric:        Mood and Affect: Mood normal.        Latest Ref Rng & Units 08/26/2021   11:16 AM 08/14/2021    3:40 PM 08/13/2021    4:51 PM  CMP  Glucose 70 - 99 mg/dL 161  096  84   BUN 6 - 20 mg/dL 13  10  8    Creatinine 0.44 - 1.00 mg/dL 0.45  4.09  8.11   Sodium 135 - 145 mmol/L 138  140  144   Potassium 3.5 - 5.1 mmol/L 3.7  3.4  3.9   Chloride 98 - 111 mmol/L 106  108  107   CO2 22 - 32 mmol/L 23  26  21    Calcium 8.9 - 10.3 mg/dL 9.3  9.3  9.3   Total Protein 6.5 - 8.1 g/dL 7.6  7.9  6.9   Total Bilirubin 0.3 - 1.2 mg/dL 0.3  0.1  <9.1   Alkaline Phos 38 - 126 U/L 49  51  60   AST 15 - 41 U/L 9  13  12    ALT 0 - 44 U/L 5  7  7      Lipid Panel     Component Value Date/Time   CHOL 149 05/17/2015 1141   TRIG 37 05/17/2015 1141   HDL 65 05/17/2015 1141   CHOLHDL 2.3 05/17/2015 1141   LDLCALC 77 05/17/2015  1141    CBC    Component Value Date/Time   WBC 9.5 03/09/2022 1121   WBC 14.5 (H) 08/14/2021 1540   RBC 3.76 (L) 03/09/2022 1121   HGB 9.8 (L) 03/09/2022 1121   HGB 6.4 (LL) 08/13/2021 1651   HCT 31.7 (L) 03/09/2022 1121   HCT 23.8 (L) 08/13/2021 1651   PLT 434 (H) 03/09/2022 1121   PLT 474 (H) 08/13/2021 1651   MCV 84.3 03/09/2022 1121   MCV 71 (L) 08/13/2021 1651   MCH 26.1 03/09/2022 1121   MCHC 30.9 03/09/2022 1121   RDW 17.2 (H) 03/09/2022 1121   RDW 18.0 (H) 08/13/2021 1651   LYMPHSABS 2.2 03/09/2022 1121   LYMPHSABS 2.6 08/13/2021 1651   MONOABS 1.0 03/09/2022 1121   EOSABS 0.3 03/09/2022 1121   EOSABS 0.3 08/13/2021 1651   BASOSABS 0.1 03/09/2022 1121   BASOSABS 0.1 08/13/2021 1651    Lab Results  Component Value Date   HGBA1C 5.6 02/16/2022    Assessment & Plan:      Menorrhagia Secondary to uterine fibroids Second menstrual cycle in October with significant cramping. History of fibroids. Last Naproxen prescription has run out. -Refill Naproxen prescription for menstrual cramps. -Order blood work to check hemoglobin and iron levels due to history of anemia.  Eczema Reports of hands acting up. Previous referral to dermatology in [place] not completed due to transportation issues. -Refer to a dermatologist in [place] for management of eczema.  Smoking  Expressed readiness to quit. -Encouraged to quit smoking on her own.  General Health Maintenance -Order Cologuard test for colon cancer screening (kit already sent to patient's home). -Order mammogram for breast cancer screening. -Order basic labs to monitor kidney function due to Naproxen use.          Meds ordered this encounter  Medications   naproxen (NAPROSYN) 500 MG tablet    Sig: Take 1 tablet (500 mg total) by mouth 2 (two) times daily with a meal.    Dispense:  60 tablet    Refill:  2   hydrOXYzine (ATARAX) 25 MG tablet    Sig: Take 1 tablet (25 mg total) by mouth every 8 (eight)  hours as needed.    Dispense:  270 tablet    Refill:  1    Follow-up: Return in about 1 year (around 03/09/2024) for Chronic medical conditions.       Hoy Register, MD, FAAFP. [facility] [place], [place] [phone]   03/10/2023, 5:40 PM

## 2023-03-11 LAB — CBC WITH DIFFERENTIAL/PLATELET
Basophils Absolute: 0.1 10*3/uL (ref 0.0–0.2)
Basos: 1 %
EOS (ABSOLUTE): 0.2 10*3/uL (ref 0.0–0.4)
Eos: 3 %
Hematocrit: 26.8 % — ABNORMAL LOW (ref 34.0–46.6)
Hemoglobin: 7.4 g/dL — ABNORMAL LOW (ref 11.1–15.9)
Immature Grans (Abs): 0 10*3/uL (ref 0.0–0.1)
Immature Granulocytes: 0 %
Lymphocytes Absolute: 2.4 10*3/uL (ref 0.7–3.1)
Lymphs: 33 %
MCH: 21.5 pg — ABNORMAL LOW (ref 26.6–33.0)
MCHC: 27.6 g/dL — ABNORMAL LOW (ref 31.5–35.7)
MCV: 78 fL — ABNORMAL LOW (ref 79–97)
Monocytes Absolute: 0.8 10*3/uL (ref 0.1–0.9)
Monocytes: 11 %
Neutrophils Absolute: 3.8 10*3/uL (ref 1.4–7.0)
Neutrophils: 52 %
Platelets: 466 10*3/uL — ABNORMAL HIGH (ref 150–450)
RBC: 3.44 x10E6/uL — ABNORMAL LOW (ref 3.77–5.28)
RDW: 17.4 % — ABNORMAL HIGH (ref 11.7–15.4)
WBC: 7.3 10*3/uL (ref 3.4–10.8)

## 2023-03-11 LAB — BASIC METABOLIC PANEL
BUN/Creatinine Ratio: 13 (ref 9–23)
BUN: 10 mg/dL (ref 6–24)
CO2: 23 mmol/L (ref 20–29)
Calcium: 9.2 mg/dL (ref 8.7–10.2)
Chloride: 108 mmol/L — ABNORMAL HIGH (ref 96–106)
Creatinine, Ser: 0.75 mg/dL (ref 0.57–1.00)
Glucose: 70 mg/dL (ref 70–99)
Potassium: 4.5 mmol/L (ref 3.5–5.2)
Sodium: 142 mmol/L (ref 134–144)
eGFR: 97 mL/min/{1.73_m2} (ref >59)

## 2023-06-02 ENCOUNTER — Ambulatory Visit: Payer: Commercial Managed Care - HMO | Admitting: Dermatology

## 2023-08-03 ENCOUNTER — Ambulatory Visit: Admission: EM | Admit: 2023-08-03 | Discharge: 2023-08-03 | Disposition: A | Discharge status: Home / Self Care

## 2023-08-03 DIAGNOSIS — L308 Other specified dermatitis: Secondary | ICD-10-CM | POA: Diagnosis not present

## 2023-08-03 DIAGNOSIS — M5441 Lumbago with sciatica, right side: Secondary | ICD-10-CM | POA: Insufficient documentation

## 2023-08-03 DIAGNOSIS — M5442 Lumbago with sciatica, left side: Secondary | ICD-10-CM | POA: Insufficient documentation

## 2023-08-03 DIAGNOSIS — G8929 Other chronic pain: Secondary | ICD-10-CM | POA: Insufficient documentation

## 2023-08-03 DIAGNOSIS — M4306 Spondylolysis, lumbar region: Secondary | ICD-10-CM | POA: Insufficient documentation

## 2023-08-03 DIAGNOSIS — M51369 Other intervertebral disc degeneration, lumbar region without mention of lumbar back pain or lower extremity pain: Secondary | ICD-10-CM | POA: Insufficient documentation

## 2023-08-03 DIAGNOSIS — J0110 Acute frontal sinusitis, unspecified: Secondary | ICD-10-CM

## 2023-08-03 DIAGNOSIS — M5030 Other cervical disc degeneration, unspecified cervical region: Secondary | ICD-10-CM | POA: Insufficient documentation

## 2023-08-03 DIAGNOSIS — M7918 Myalgia, other site: Secondary | ICD-10-CM | POA: Insufficient documentation

## 2023-08-03 DIAGNOSIS — R051 Acute cough: Secondary | ICD-10-CM

## 2023-08-03 DIAGNOSIS — M542 Cervicalgia: Secondary | ICD-10-CM | POA: Insufficient documentation

## 2023-08-03 DIAGNOSIS — M4302 Spondylolysis, cervical region: Secondary | ICD-10-CM | POA: Insufficient documentation

## 2023-08-03 MED ORDER — PREDNISONE 20 MG PO TABS: 40.0000 mg | ORAL_TABLET | Freq: Every day | ORAL | 0 refills | Status: DC

## 2023-08-03 MED ORDER — TRIAMCINOLONE ACETONIDE 0.025 % EX OINT
1.0000 | TOPICAL_OINTMENT | Freq: Three times a day (TID) | CUTANEOUS | 1 refills | Status: DC | PRN
Start: 2023-08-03 — End: 2024-03-08

## 2023-08-03 MED ORDER — HYDROXYZINE HCL 25 MG PO TABS: 25.0000 mg | ORAL_TABLET | Freq: Three times a day (TID) | ORAL | 0 refills | Status: DC | PRN

## 2023-08-03 MED ORDER — DOXYCYCLINE HYCLATE 100 MG PO CAPS: 100.0000 mg | ORAL_CAPSULE | Freq: Two times a day (BID) | ORAL | 0 refills | Status: DC

## 2023-08-03 NOTE — ED Triage Notes (Signed)
"  I have several things going on". "I think my eczema may be flaring up in various areas". "I also have a cough for about 3 wks now". No fever.

## 2023-08-03 NOTE — ED Provider Notes (Signed)
 [name]    CSN: [id number] Arrival date & time: 08/03/23  4098      History   Chief Complaint Chief Complaint  Patient presents with   Cough   Skin Problem    HPI Lori Perkins is a 53 y.o. female.   Patient here today for evaluation of a cough and persistent nasal congestion x 3 weeks.  Patient has been treating with OTC occasion without improvement.  Initially thought nasal symptoms were related to outdoor allergens however symptoms have worsened over the last week.  Denies any shortness of breath or wheezing.  Patient has a history of eczema and reports a recent flare which has distributed over bilateral hands which is pruritic and she also has a small rash on the right lower chin area which is now itching and red. Currently out of her eczema medications as she has been stable for a while.  Patient was referred to dermatology a while back however it was located in [place] and she could not get to that particular office.  Is in search of a close her dermatologist for treatment of eczema. 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   Lumbar spondylolysis 08/03/2023   Lumbago with sciatica, left side 08/03/2023   Lumbago with sciatica, right side 08/03/2023   Myofascial pain 08/03/2023   Neck pain 08/03/2023   Other chronic pain 08/03/2023   Eczema 09/24/2021   Fibroids, intramural 09/24/2021   Iron deficiency anemia due to chronic blood loss 08/26/2021   Fibroids 06/22/2019   Well woman exam with routine gynecological exam 06/01/2019    Breast pain 06/01/2019   Menorrhagia with regular cycle 05/01/2019   History of sexually transmitted disease 05/17/2015   History of abnormal cervical Pap smear 05/17/2015   Hemorrhoids 05/17/2015   Cold intolerance 05/17/2015   Preventative health care 05/17/2015   Tobacco use disorder 05/17/2015    Past Surgical History:  Procedure Laterality Date   MULTIPLE TOOTH EXTRACTIONS      OB History     Gravida  0   Para  0   Term  0   Preterm  0   AB  0   Living  0      SAB  0   IAB  0   Ectopic  0   Multiple  0   Live Births  0            Home Medications    Prior to Admission medications   Medication Sig Start Date End Date Taking? Authorizing Provider  acetaminophen (TYLENOL) 325 MG tablet Take 325 mg by mouth every 6 (six) hours as needed.   Yes [provider]  acetaminophen (TYLENOL) 500 MG tablet Take 1,000 mg by mouth every 6 (six) hours as needed for mild pain (pain score 1-3).   Yes [provider]  diazepam (VALIUM) 5 MG tablet Take 5 mg by mouth as directed. 06/26/19  Yes [provider]  doxycycline (VIBRAMYCIN) 100 MG capsule Take 1 capsule (100 mg total) by mouth 2 (two) times daily.  08/03/23  Yes Bing Neighbors, NP  hydrOXYzine (ATARAX) 25 MG tablet Take 1 tablet (25 mg total) by mouth every 8 (eight) hours as needed for itching. 08/03/23  Yes Bing Neighbors, NP  predniSONE (DELTASONE) 20 MG tablet Take 2 tablets (40 mg total) by mouth daily with breakfast. 08/03/23  Yes Bing Neighbors, NP  tiZANidine (ZANAFLEX) 2 MG tablet Take 2 mg by mouth 3 (three) times daily. 07/04/19  Yes [provider]  UNKNOWN TO PATIENT Severe cold and allergy (OTC)   Yes [provider]  amLODipine (NORVASC) 2.5 MG tablet Take 1 tablet (2.5 mg total) by mouth daily. Patient not taking: Reported on 03/10/2023 02/16/22   Hoy Register, MD  FeFum-FePoly-FA-B Cmp-C-Biot (INTEGRA PLUS) CAPS Take 1 capsule by mouth  daily. Patient not taking: Reported on 03/10/2023 03/09/22   Si Gaul, MD  mupirocin ointment (BACTROBAN) 2 % Apply 1 Application topically 2 (two) times daily. Patient not taking: Reported on 03/10/2023 08/26/22   Mardella Layman, MD  naproxen (NAPROSYN) 500 MG tablet Take 1 tablet (500 mg total) by mouth 2 (two) times daily with a meal. 03/10/23   Hoy Register, MD  NAPROXEN PO Naproxen    [provider]  polyethylene glycol powder (GLYCOLAX/MIRALAX) 17 GM/SCOOP powder Take 17 g by mouth daily. Patient not taking: Reported on 02/16/2022 09/24/21   Hoy Register, MD  triamcinolone (KENALOG) 0.025 % ointment Apply 1 Application topically 3 (three) times daily as needed (Eczema). 08/03/23   Bing Neighbors, NP  diphenhydrAMINE (BENADRYL) 25 MG tablet Take 1 tablet (25 mg total) by mouth every 6 (six) hours as needed. 09/12/19 02/06/20  Hall-Potvin, [place], PA-C    Family History Family History  Problem Relation Age of Onset   Diabetes Mother    Hypertension Father    Stroke Father    Cancer Maternal Aunt    Breast cancer Maternal Aunt 59   Cancer Maternal Uncle    Diabetes Maternal Grandmother    Breast cancer Maternal Grandmother 39    Social History Social History   Tobacco Use   Smoking status: Every Day    Current packs/day: 0.50    Average packs/day: 0.5 packs/day for 20.0 years (10.0 ttl pk-yrs)    Types: Cigarettes   Smokeless tobacco: Never  Vaping Use   Vaping status: Former  Substance Use Topics   Alcohol use: Yes    Comment: occasionally   Drug use: Yes    Types: Marijuana     Allergies   Penicillins   Review of Systems Review of Systems  Respiratory:  Positive for cough.      Physical Exam Triage Vital Signs ED Triage Vitals  Encounter Vitals Group     BP 08/03/23 1032 (!) 145/77     Systolic BP Percentile --      Diastolic BP Percentile --      Pulse Rate 08/03/23 1032 91     Resp 08/03/23 1032 18     Temp 08/03/23 1032 98.2  F (36.8 C)     Temp Source 08/03/23 1032 Oral     SpO2 08/03/23 1032 97 %     Weight 08/03/23 1029 130 lb (59 kg)     Height 08/03/23 1029 5\' 7"  (1.702 m)     Head Circumference --      Peak Flow --      Pain Score 08/03/23 1029 0     Pain Loc --      Pain Education --  Exclude from Growth Chart --    No data found.  Updated Vital Signs BP (!) 145/77 (BP Location: Left Arm)   Pulse 91   Temp 98.2 F (36.8 C) (Oral)   Resp 18   Ht 5\' 7"  (1.702 m)   Wt 130 lb (59 kg)   LMP  (Exact Date)   SpO2 97%   BMI 20.36 kg/m   Visual Acuity Right Eye Distance:   Left Eye Distance:   Bilateral Distance:    Right Eye Near:   Left Eye Near:    Bilateral Near:     Physical Exam Vitals reviewed.  Constitutional:      Appearance: Normal appearance. She is not ill-appearing.  HENT:     Head: Normocephalic and atraumatic.  Cardiovascular:     Rate and Rhythm: Normal rate and regular rhythm.  Pulmonary:     Effort: Pulmonary effort is normal.     Breath sounds: Normal breath sounds.  Skin:    General: Skin is warm and dry.     Capillary Refill: Capillary refill takes less than 2 seconds.     Findings: Erythema and rash present. Rash is macular and scaling.     Comments: X emesis rash present on bilateral hands and lower right  Chin.  Neurological:     General: No focal deficit present.     Mental Status: She is alert.      UC Treatments / Results  Labs (all labs ordered are listed, but only abnormal results are displayed) Labs Reviewed - No data to display  EKG   Radiology No results found.  Procedures Procedures (including critical care time)  Medications Ordered in UC Medications - No data to display  Initial Impression / Assessment and Plan / UC Course  I have reviewed the triage vital signs and the nursing notes.  Pertinent labs & imaging results that were available during my care of the patient were reviewed by me and considered in my medical  decision making (see chart for details).    Acute nonrecurrent frontal sinusitis and cough,  Treatment with Doxycycline x 10 days Recurrent eczema, treatment with prednisone 40 mg daily x 5 days triamcinolone cream 3 times daily as needed.  Placed to dermatology.  Return precautions given. Final Clinical Impressions(s) / UC Diagnoses   Final diagnoses:  Acute non-recurrent frontal sinusitis  Acute cough  Other eczema     Discharge Instructions      Discussed continue efforts to follow-up with dermatology.  I have included a referral to [facility]. You can call there office after 24 hours to schedule an appointment for treatment of recurrent eczema      ED Prescriptions     Medication Sig Dispense Auth. Provider   hydrOXYzine (ATARAX) 25 MG tablet Take 1 tablet (25 mg total) by mouth every 8 (eight) hours as needed for itching. 30 tablet Bing Neighbors, NP   predniSONE (DELTASONE) 20 MG tablet Take 2 tablets (40 mg total) by mouth daily with breakfast. 10 tablet Bing Neighbors, NP   doxycycline (VIBRAMYCIN) 100 MG capsule Take 1 capsule (100 mg total) by mouth 2 (two) times daily. 20 capsule Bing Neighbors, NP   triamcinolone (KENALOG) 0.025 % ointment Apply 1 Application topically 3 (three) times daily as needed (Eczema). 160 g Bing Neighbors, NP      PDMP not reviewed this encounter.   Bing Neighbors, NP 08/03/23 1409

## 2023-08-03 NOTE — Discharge Instructions (Signed)
 Discussed continue efforts to follow-up with dermatology.  I have included a referral to [facility]. You can call there office after 24 hours to schedule an appointment for treatment of recurrent eczema

## 2023-12-14 ENCOUNTER — Encounter: Payer: Self-pay | Admitting: Emergency Medicine

## 2023-12-14 ENCOUNTER — Ambulatory Visit
Admission: EM | Admit: 2023-12-14 | Discharge: 2023-12-14 | Disposition: A | Discharge status: Home / Self Care | Attending: Family Medicine | Admitting: Family Medicine

## 2023-12-14 DIAGNOSIS — K0889 Other specified disorders of teeth and supporting structures: Secondary | ICD-10-CM

## 2023-12-14 MED ORDER — CLINDAMYCIN HCL 300 MG PO CAPS: 300.0000 mg | ORAL_CAPSULE | Freq: Three times a day (TID) | ORAL | 0 refills | Status: AC

## 2023-12-14 MED ORDER — IBUPROFEN 800 MG PO TABS: 800.0000 mg | ORAL_TABLET | Freq: Three times a day (TID) | ORAL | 0 refills | Status: DC

## 2023-12-14 NOTE — ED Provider Notes (Signed)
 [facility]ENTER   [id number] 12/14/23 Arrival Time: 1123  ASSESSMENT & PLAN:  1. Pain, dental    No sign of abscess requiring I&D at this time. Discussed.  Meds ordered this encounter  Medications   clindamycin  (CLEOCIN ) 300 MG capsule    Sig: Take 1 capsule (300 mg total) by mouth 3 (three) times daily.    Dispense:  30 capsule    Refill:  0   ibuprofen  (ADVIL ) 800 MG tablet    Sig: Take 1 tablet (800 mg total) by mouth 3 (three) times daily with meals.    Dispense:  21 tablet    Refill:  0   Does have a dentist she can see.  Follow-up Information     [facility] [place]).   Specialty: Urgent Care Why: If worsening or failing to improve as anticipated. Contact information: [address] [place]  [address] [phone]                 Reviewed expectations re: course of current medical issues. Questions answered. Outlined signs and symptoms indicating need for more acute intervention. Patient verbalized understanding. After Visit Summary given.   SUBJECTIVE:  Lori Perkins is a 53 y.o. female who reports graudal onset of dental pain; L upper molar; sev days. Denies fever. Tolerating PO intake. Tylenol  without much relief.   OBJECTIVE: Vitals:   12/14/23 1235 12/14/23 1237  BP:  (!) 155/86  Pulse:  89  Resp:  18  Temp:  98.8 F (37.1 C)  TempSrc:  Oral  SpO2:  99%  Weight: 56 kg     General appearance: alert; no distress HENT: normocephalic; atraumatic; dentition: poor; left upper gum without areas of fluctuance, drainage, or bleeding and with tenderness to palpation; normal jaw movement without difficulty Neck: supple without LAD; FROM; trachea midline Lungs: normal respirations; unlabored; speaks full sentences without difficulty Skin: warm and dry Psychological: alert and cooperative; normal mood and affect  Allergies  Allergen Reactions   Penicillins     Has  patient had a PCN reaction causing immediate rash, facial/tongue/throat swelling, SOB or lightheadedness with hypotension: Unknown Has patient had a PCN reaction causing severe rash involving mucus membranes or skin necrosis: Unknown Has patient had a PCN reaction that required hospitalization: Unknown Has patient had a PCN reaction occurring within the last 10 years: No If all of the above answers are NO, then may proceed with Cephalosporin use.    Past Medical History:  Diagnosis Date   GERD (gastroesophageal reflux disease)    History of abnormal cervical Pap smear 2013   2013 Pap smear LSIL+, HPV not detected, 2014 Colposcopy with benign tissue    History of sexually transmitted disease 01/16/2014   BV, trichomonas   Social History   Socioeconomic History   Marital status: Single    Spouse name: Not on file   Number of children: Not on file   Years of education: Not on file   Highest education level: Some college, no degree  Occupational History   Not on file  Tobacco Use   Smoking status: Every Day    Current packs/day: 0.50    Average packs/day: 0.5 packs/day for 20.0 years (10.0 ttl pk-yrs)    Types: Cigarettes    Passive exposure: Current   Smokeless tobacco: Never  Vaping Use   Vaping status: Former  Substance and Sexual Activity   Alcohol use: Yes    Comment: occasionally  Drug use: Yes    Types: Marijuana   Sexual activity: Yes    Birth control/protection: Condom  Other Topics Concern   Not on file  Social History Narrative   Not on file   Social Drivers of Health   Financial Resource Strain: Not on file  Food Insecurity: Not on file  Transportation Needs: No Transportation Needs (06/01/2019)   PRAPARE - Administrator, Civil Service (Medical): No    Lack of Transportation (Non-Medical): No  Physical Activity: Not on file  Stress: Not on file  Social Connections: Unknown (09/23/2021)   Received from [facility]   Social Network     Social Network: Not on file  Intimate Partner Violence: Unknown (08/15/2021)   Received from [facility]   HITS    Physically Hurt: Not on file    Insult or Talk Down To: Not on file    Threaten Physical Harm: Not on file    Scream or Curse: Not on file   Family History  Problem Relation Age of Onset   Diabetes Mother    Hypertension Father    Stroke Father    Cancer Maternal Aunt    Breast cancer Maternal Aunt 71   Cancer Maternal Uncle    Diabetes Maternal Grandmother    Breast cancer Maternal Grandmother 89   Past Surgical History:  Procedure Laterality Date   MULTIPLE TOOTH DORITA Rolinda Rogue, MD 12/14/23 1346

## 2023-12-14 NOTE — ED Triage Notes (Addendum)
 Pt presents c/o dental pain x 4 days. Pt says the tooth that is causing the pain is broken and the root still remains in the gums. Pt reports swelling in gums on upper left side of mouth. Pt says she believe the area has infection.

## 2023-12-15 ENCOUNTER — Ambulatory Visit

## 2024-03-08 ENCOUNTER — Ambulatory Visit: Payer: Commercial Managed Care - HMO | Attending: Family Medicine | Admitting: Family Medicine

## 2024-03-08 ENCOUNTER — Encounter: Payer: Self-pay | Admitting: Family Medicine

## 2024-03-08 VITALS — BP 139/60 | HR 91 | Temp 98.0°F | Ht 67.0 in | Wt 121.4 lb

## 2024-03-08 DIAGNOSIS — Z0001 Encounter for general adult medical examination with abnormal findings: Secondary | ICD-10-CM

## 2024-03-08 DIAGNOSIS — D251 Intramural leiomyoma of uterus: Secondary | ICD-10-CM | POA: Diagnosis not present

## 2024-03-08 DIAGNOSIS — Z1211 Encounter for screening for malignant neoplasm of colon: Secondary | ICD-10-CM

## 2024-03-08 DIAGNOSIS — R634 Abnormal weight loss: Secondary | ICD-10-CM | POA: Diagnosis not present

## 2024-03-08 DIAGNOSIS — F17210 Nicotine dependence, cigarettes, uncomplicated: Secondary | ICD-10-CM

## 2024-03-08 DIAGNOSIS — Z131 Encounter for screening for diabetes mellitus: Secondary | ICD-10-CM

## 2024-03-08 DIAGNOSIS — L308 Other specified dermatitis: Secondary | ICD-10-CM

## 2024-03-08 DIAGNOSIS — Z1231 Encounter for screening mammogram for malignant neoplasm of breast: Secondary | ICD-10-CM

## 2024-03-08 MED ORDER — PREDNISONE 20 MG PO TABS: 20.0000 mg | ORAL_TABLET | Freq: Every day | ORAL | 0 refills | Status: DC

## 2024-03-08 MED ORDER — TRIAMCINOLONE ACETONIDE 0.025 % EX OINT
1.0000 | TOPICAL_OINTMENT | Freq: Two times a day (BID) | CUTANEOUS | 1 refills | Status: AC
Start: 2024-03-08 — End: ?

## 2024-03-08 MED ORDER — HYDROXYZINE HCL 25 MG PO TABS: 25.0000 mg | ORAL_TABLET | Freq: Three times a day (TID) | ORAL | 0 refills | Status: AC | PRN

## 2024-03-08 MED ORDER — IBUPROFEN 800 MG PO TABS: 800.0000 mg | ORAL_TABLET | Freq: Three times a day (TID) | ORAL | 0 refills | Status: DC

## 2024-03-08 NOTE — Patient Instructions (Addendum)
 [facility] Address: [address], [place], [place] [address] Phone: [phone]

## 2024-03-08 NOTE — Progress Notes (Signed)
 Subjective:  Patient ID: Lori Perkins, female    DOB: 12/08/1970  Age: 53 y.o. MRN: [id number]  CC: Annual Exam (ECZEMA)     Discussed the use of AI scribe software for clinical note transcription with the patient, who gave verbal consent to proceed.  History of Present Illness Lori Perkins is a 53 year old female with a history of abnormal uterine bleed secondary to uterine fibroids with associated anemia, eczema. who presents for an annual physical exam.  She has not had a mammogram since the last order expired and has never had a colonoscopy. Her last Pap smear in January 2021 was negative.  Her eczema has flared up in the last two weeks with swelling noted in the last two days. She last used prednisone  in March 2025 and has been using hydroxyzine  for itching, which she ran out of 30 days ago. She does not have any triamcinolone  ointment left and has been using Cerave.  She experiences menstrual cycle-related pain and uses ibuprofen  800 mg three times a day for relief. She experiences cramps in her legs and abdomen, particularly during her menstrual cycle, with a recent episode of severe cramps at 4 AM that resolved after an hour. She continues to feel soreness in her legs.  She has a large fibroid, initially noted in 2020, which she can feel during her menstrual cycle. She reports weight loss from 130 lbs in March 2025 to 121 lbs recently She has no changes in appetite but mentions eating less meat.  She experiences ringing in her ears and notes a buildup of earwax over the past few months. Her legs feel cold and cramped at night, despite wearing socks.  She acknowledges not exercising regularly, though she is on her feet during the day. She estimates her fruit and vegetable intake may have decreased in recent weeks. She does not see a dentist regularly and expresses a need for a referral.    Past Medical History:  Diagnosis Date   GERD (gastroesophageal reflux  disease)    History of abnormal cervical Pap smear 2013   2013 Pap smear LSIL+, HPV not detected, 2014 Colposcopy with benign tissue    History of sexually transmitted disease 01/16/2014   BV, trichomonas    Past Surgical History:  Procedure Laterality Date   MULTIPLE TOOTH EXTRACTIONS      Family History  Problem Relation Age of Onset   Diabetes Mother    Hypertension Father    Stroke Father    Cancer Maternal Aunt    Breast cancer Maternal Aunt 50   Cancer Maternal Uncle    Diabetes Maternal Grandmother    Breast cancer Maternal Grandmother 44    Social History   Socioeconomic History   Marital status: Single    Spouse name: Not on file   Number of children: Not on file   Years of education: Not on file   Highest education level: Some college, no degree  Occupational History   Not on file  Tobacco Use   Smoking status: Every Day    Current packs/day: 0.50    Average packs/day: 0.5 packs/day for 20.0 years (10.0 ttl pk-yrs)    Types: Cigarettes    Passive exposure: Current   Smokeless tobacco: Never  Vaping Use   Vaping status: Former  Substance and Sexual Activity   Alcohol use: Yes    Comment: occasionally   Drug use: Yes    Types: Marijuana   Sexual activity: Yes  Birth control/protection: Condom  Other Topics Concern   Not on file  Social History Narrative   Not on file   Social Drivers of Health   Financial Resource Strain: Not on file  Food Insecurity: Not on file  Transportation Needs: No Transportation Needs (06/01/2019)   PRAPARE - Administrator, Civil Service (Medical): No    Lack of Transportation (Non-Medical): No  Physical Activity: Not on file  Stress: Not on file  Social Connections: Unknown (09/23/2021)   Received from [facility]   Social Network    Social Network: Not on file    Allergies  Allergen Reactions   Penicillins     Has patient had a PCN reaction causing immediate rash, facial/tongue/throat swelling,  SOB or lightheadedness with hypotension: Unknown Has patient had a PCN reaction causing severe rash involving mucus membranes or skin necrosis: Unknown Has patient had a PCN reaction that required hospitalization: Unknown Has patient had a PCN reaction occurring within the last 10 years: No If all of the above answers are NO, then may proceed with Cephalosporin use.    Outpatient Medications Prior to Visit  Medication Sig Dispense Refill   acetaminophen  (TYLENOL ) 325 MG tablet Take 325 mg by mouth every 6 (six) hours as needed. (Patient not taking: Reported on 03/08/2024)     acetaminophen  (TYLENOL ) 500 MG tablet Take 1,000 mg by mouth every 6 (six) hours as needed for mild pain (pain score 1-3). (Patient not taking: Reported on 03/08/2024)     amLODipine  (NORVASC ) 2.5 MG tablet Take 1 tablet (2.5 mg total) by mouth daily. (Patient not taking: Reported on 03/08/2024) 90 tablet 1   clindamycin  (CLEOCIN ) 300 MG capsule Take 1 capsule (300 mg total) by mouth 3 (three) times daily. (Patient not taking: Reported on 03/08/2024) 30 capsule 0   diazepam (VALIUM) 5 MG tablet Take 5 mg by mouth as directed. (Patient not taking: Reported on 03/08/2024)     FeFum-FePoly-FA-B Cmp-C-Biot (INTEGRA PLUS ) CAPS Take 1 capsule by mouth daily. (Patient not taking: Reported on 03/08/2024) 30 capsule 3   mupirocin  ointment (BACTROBAN ) 2 % Apply 1 Application topically 2 (two) times daily. (Patient not taking: Reported on 03/08/2024) 22 g 0   polyethylene glycol powder (GLYCOLAX /MIRALAX ) 17 GM/SCOOP powder Take 17 g by mouth daily. (Patient not taking: Reported on 03/08/2024) 3350 g 1   tiZANidine (ZANAFLEX) 2 MG tablet Take 2 mg by mouth 3 (three) times daily. (Patient not taking: Reported on 03/08/2024)     UNKNOWN TO PATIENT Severe cold and allergy (OTC) (Patient not taking: Reported on 03/08/2024)     hydrOXYzine  (ATARAX ) 25 MG tablet Take 1 tablet (25 mg total) by mouth every 8 (eight) hours as needed for  itching. (Patient not taking: Reported on 03/08/2024) 30 tablet 0   ibuprofen  (ADVIL ) 800 MG tablet Take 1 tablet (800 mg total) by mouth 3 (three) times daily with meals. (Patient not taking: Reported on 03/08/2024) 21 tablet 0   predniSONE  (DELTASONE ) 20 MG tablet Take 2 tablets (40 mg total) by mouth daily with breakfast. (Patient not taking: Reported on 03/08/2024) 10 tablet 0   triamcinolone  (KENALOG ) 0.025 % ointment Apply 1 Application topically 3 (three) times daily as needed (Eczema). (Patient not taking: Reported on 03/08/2024) 160 g 1   No facility-administered medications prior to visit.     ROS Review of Systems  Constitutional:  Negative for activity change and appetite change.  HENT:  Negative for sinus pressure and sore throat.  Respiratory:  Negative for chest tightness, shortness of breath and wheezing.   Cardiovascular:  Negative for chest pain and palpitations.  Gastrointestinal:  Negative for abdominal distention, abdominal pain and constipation.  Genitourinary: Negative.   Musculoskeletal:        See HPI  Psychiatric/Behavioral:  Negative for behavioral problems and dysphoric mood.     Objective:  BP 139/60   Pulse 91   Temp 98 F (36.7 C) (Oral)   Ht 5' 7 (1.702 m)   Wt 121 lb 6.4 oz (55.1 kg)   SpO2 100%   BMI 19.01 kg/m      03/08/2024    4:09 PM 03/08/2024    3:24 PM 12/14/2023   12:37 PM  BP/Weight  Systolic BP 139 149 155  Diastolic BP 60 72 86  Wt. (Lbs)  121.4   BMI  19.01 kg/m2     Wt Readings from Last 3 Encounters:  03/08/24 121 lb 6.4 oz (55.1 kg)  12/14/23 123 lb 8 oz (56 kg)  08/03/23 130 lb (59 kg)     Physical Exam Constitutional:      General: She is not in acute distress.    Appearance: She is well-developed. She is not diaphoretic.  HENT:     Head: Normocephalic.     Right Ear: External ear normal. There is impacted cerumen.     Left Ear: External ear normal. There is impacted cerumen.     Nose: Nose normal.      Mouth/Throat:     Mouth: Mucous membranes are moist.  Eyes:     Extraocular Movements: Extraocular movements intact.     Conjunctiva/sclera: Conjunctivae normal.     Pupils: Pupils are equal, round, and reactive to light.  Neck:     Vascular: No JVD.  Cardiovascular:     Rate and Rhythm: Normal rate and regular rhythm.     Pulses: Normal pulses.     Heart sounds: Normal heart sounds. No murmur heard.    No gallop.  Pulmonary:     Effort: Pulmonary effort is normal. No respiratory distress.     Breath sounds: Normal breath sounds. No wheezing or rales.  Chest:     Chest wall: No tenderness.  Abdominal:     General: Bowel sounds are normal. There is no distension.     Palpations: Abdomen is soft. There is no mass.     Tenderness: There is no abdominal tenderness.  Musculoskeletal:        General: No tenderness. Normal range of motion.     Cervical back: Normal range of motion.  Skin:    General: Skin is warm and dry.  Neurological:     Mental Status: She is alert and oriented to person, place, and time.     Deep Tendon Reflexes: Reflexes are normal and symmetric.  Psychiatric:        Mood and Affect: Mood normal.        Latest Ref Rng & Units 03/10/2023    3:46 PM 08/26/2021   11:16 AM 08/14/2021    3:40 PM  CMP  Glucose 70 - 99 mg/dL 70  890  898   BUN 6 - 24 mg/dL 10  13  10    Creatinine 0.57 - 1.00 mg/dL 9.24  9.14  9.30   Sodium 134 - 144 mmol/L 142  138  140   Potassium 3.5 - 5.2 mmol/L 4.5  3.7  3.4   Chloride 96 - 106 mmol/L 108  106  108   CO2 20 - 29 mmol/L 23  23  26    Calcium 8.7 - 10.2 mg/dL 9.2  9.3  9.3   Total Protein 6.5 - 8.1 g/dL  7.6  7.9   Total Bilirubin 0.3 - 1.2 mg/dL  0.3  0.1   Alkaline Phos 38 - 126 U/L  49  51   AST 15 - 41 U/L  9  13   ALT 0 - 44 U/L  5  7     Lipid Panel     Component Value Date/Time   CHOL 149 05/17/2015 1141   TRIG 37 05/17/2015 1141   HDL 65 05/17/2015 1141   CHOLHDL 2.3 05/17/2015 1141   LDLCALC 77 05/17/2015  1141    CBC    Component Value Date/Time   WBC 7.3 03/10/2023 1546   WBC 9.5 03/09/2022 1121   WBC 14.5 (H) 08/14/2021 1540   RBC 3.44 (L) 03/10/2023 1546   RBC 3.76 (L) 03/09/2022 1121   HGB 7.4 (L) 03/10/2023 1546   HCT 26.8 (L) 03/10/2023 1546   PLT 466 (H) 03/10/2023 1546   MCV 78 (L) 03/10/2023 1546   MCH 21.5 (L) 03/10/2023 1546   MCH 26.1 03/09/2022 1121   MCHC 27.6 (L) 03/10/2023 1546   MCHC 30.9 03/09/2022 1121   RDW 17.4 (H) 03/10/2023 1546   LYMPHSABS 2.4 03/10/2023 1546   MONOABS 1.0 03/09/2022 1121   EOSABS 0.2 03/10/2023 1546   BASOSABS 0.1 03/10/2023 1546    Lab Results  Component Value Date   HGBA1C 5.6 02/16/2022       Assessment & Plan Annual visit for general adult medical condition with abnormal findings Annual wellness visit to ensure up-to-date health screenings and preventive care. - Order mammogram. - Refer for colonoscopy. - Discussed Pap smear due in January 2026. - Encourage intake of five servings of fruits and vegetables per day. - Recommend 150 minutes of exercise per week. - Refer to dentist for annual cleaning.  Uterine fibroids Abnormal uterine bleeding and dysmenorrhea secondary to uterine fibroids Large uterine fibroid causing symptoms. Prefers to avoid surgery. - Refer to GYN for evaluation and discussion of potential surgery.  Unintentional weight loss Unintentional 9-pound weight loss over six months. Discussed potential causes including thyroid issues and cancer. - Order blood tests for kidney, liver, cholesterol, anemia, diabetes, and thyroid function. - Schedule follow-up in 3 months to monitor weight.   Eczema Eczema flare-up with recent swelling. Previous prednisone  treatment. Hydroxyzine  helps itching but unavailable. No triamcinolone  ointment. - Prescribe prednisone  for 5 days. - Prescribe hydroxyzine  for itching. - Refill triamcinolone  ointment. - Previously referred to dermatology and her chart indicates  dermatology office has been unsuccessful in reaching her.  We will provide her with a number to dermatology       Healthcare maintenance Screening for colon cancer-referred for colonoscopy Screening for diabetes-A1c ordered  Meds ordered this encounter  Medications   predniSONE  (DELTASONE ) 20 MG tablet    Sig: Take 1 tablet (20 mg total) by mouth daily with breakfast.    Dispense:  5 tablet    Refill:  0   hydrOXYzine  (ATARAX ) 25 MG tablet    Sig: Take 1 tablet (25 mg total) by mouth every 8 (eight) hours as needed for itching.    Dispense:  30 tablet    Refill:  0   triamcinolone  (KENALOG ) 0.025 % ointment    Sig: Apply 1 Application topically 2 (two) times daily.    Dispense:  160 g    Refill:  1   ibuprofen  (ADVIL ) 800 MG tablet    Sig: Take 1 tablet (800 mg total) by mouth 3 (three) times daily with meals.    Dispense:  21 tablet    Refill:  0    Follow-up: Return in about 3 months (around 06/08/2024) for weight loss.       Corrina Sabin, MD, FAAFP. [facility] [place], [place] [phone]   03/08/2024, 5:05 PM

## 2024-03-09 ENCOUNTER — Other Ambulatory Visit: Payer: Self-pay | Admitting: Family Medicine

## 2024-03-09 DIAGNOSIS — Z1231 Encounter for screening mammogram for malignant neoplasm of breast: Secondary | ICD-10-CM

## 2024-03-27 ENCOUNTER — Other Ambulatory Visit: Payer: Self-pay | Admitting: Family Medicine

## 2024-03-27 NOTE — Telephone Encounter (Signed)
 Copied from CRM #[id number]. Topic: Clinical - Medication Refill >> Mar 27, 2024 12:04 PM Pinkey ORN wrote: Medication: naproxen  (NAPROSYN ) 500 MG tablet, predniSONE  (DELTASONE ) 20 MG tablet  Has the patient contacted their pharmacy? Yes (Agent: If no, request that the patient contact the pharmacy for the refill. If patient does not wish to contact the pharmacy document the reason why and proceed with request.) (Agent: If yes, when and what did the pharmacy advise?)  This is the patient's preferred pharmacy:  [facility] [address] ([address]), [place] - [address] [place] ([place]) [place] [address] Phone: [phone] Fax: [phone]   Is this the correct pharmacy for this prescription? Yes If no, delete pharmacy and type the correct one.   Has the prescription been filled recently? Yes  Is the patient out of the medication? Yes  Has the patient been seen for an appointment in the last year OR does the patient have an upcoming appointment? Yes  Can we respond through MyChart? Yes  Agent: Please be advised that Rx refills may take up to 3 business days. We ask that you follow-up with your pharmacy.

## 2024-03-29 ENCOUNTER — Ambulatory Visit: Payer: Self-pay

## 2024-03-29 NOTE — Telephone Encounter (Signed)
 FYI Only or Action Required?: FYI only for provider: pt is requesting another round of steroids and naproxen .  Patient was last seen in primary care on 03/08/2024 by Delbert Clam, MD.  Called Nurse Triage reporting Rash.  Symptoms began about a month ago.  Interventions attempted: Prescription medications: prednisone  and naproxen .  Symptoms are: gradually worsening.  Triage Disposition: Call PCP When Office is Open (overriding See PCP When Office is Open (Within 3 Days))  Patient/caregiver understands and will follow disposition?: Yes   Copied from CRM #[id number]. Topic: Clinical - Red Word Triage >> Mar 29, 2024  2:40 PM Antwanette L wrote: Red Word that prompted transfer to Nurse Triage: The patient is reporting an eczema flare-up on both hands, with pain.The patient called to request a status update on refills for prednisone  (Deltasone ) 20 mg tablet and naproxen  (Naprosyn ) 500 mg tablet. The naproxen  is not currently listed on the patient's chart. I informed the patient that the refill request was submitted on 11/17 and that processing may take up to 3 business days. Reason for Disposition  Localized rash present > 7 days  Answer Assessment - Initial Assessment Questions Pt states that she was seen for th is on 10/29. She states that she did complete the 5 days of prednisone  and naproxen . She states that doesn't want to have to go to UC for a scriipt when she just saw her dr for the same thing. She states that it was just on her hands, but states that it is trying to start on her face. Pt denies any shortness of breath, swelling, or chest pain.    1. APPEARANCE of RASH: What does the rash look like? (e.g., blisters, dry flaky skin, red spots, redness, sores)     Eczema  2. LOCATION: Where is the rash located?      Bilateral tops of hands, states it is starting on neck and face   3. ONSET: When did the rash start?      About a month  4. PAIN: Does the rash hurt? If Yes,  ask: How bad is the pain?  (Scale 0-10; or none, mild, moderate, severe)     10/10 5. OTHER SYMPTOMS: Do you have any other symptoms? (e.g., fever)     denies  Protocols used: Rash or Redness - Localized-A-AH

## 2024-03-30 ENCOUNTER — Other Ambulatory Visit: Payer: Self-pay | Admitting: Family Medicine

## 2024-03-30 ENCOUNTER — Telehealth: Payer: Self-pay | Admitting: Family Medicine

## 2024-03-30 MED ORDER — NAPROXEN 500 MG PO TABS: 500.0000 mg | ORAL_TABLET | Freq: Two times a day (BID) | ORAL | 1 refills | Status: AC

## 2024-03-30 MED ORDER — PREDNISONE 20 MG PO TABS: 20.0000 mg | ORAL_TABLET | Freq: Every day | ORAL | 0 refills | Status: DC

## 2024-03-30 MED ORDER — NAPROXEN 500 MG PO TABS: 500.0000 mg | ORAL_TABLET | Freq: Two times a day (BID) | ORAL | 1 refills | Status: DC

## 2024-03-30 NOTE — Telephone Encounter (Signed)
 Copied from CRM [id number]. Topic: Clinical - Prescription Issue >> Mar 30, 2024  4:44 PM Rea ORN wrote: Reason for CRM: Pt called to check status of Prednisone  rx that was sent today. The med detail for class says Phone In but the pharmacy told pt that they did not receive the rx.   Please call back [phone] advise where this rx was sent to.

## 2024-03-30 NOTE — Telephone Encounter (Addendum)
 Noted

## 2024-03-30 NOTE — Telephone Encounter (Signed)
Prescriptions have been sent to her pharmacy

## 2024-03-31 MED ORDER — PREDNISONE 20 MG PO TABS: 20.0000 mg | ORAL_TABLET | Freq: Every day | ORAL | 0 refills | Status: AC

## 2024-03-31 NOTE — Telephone Encounter (Signed)
Prednisone resent

## 2024-06-14 ENCOUNTER — Ambulatory Visit: Admitting: Family Medicine
